# Patient Record
Sex: Female | Born: 1990
Health system: Southern US, Community
[De-identification: ages and names within clinical notes are randomized; demographics above are authoritative.]

## PROBLEM LIST (undated history)

## (undated) DIAGNOSIS — N840 Polyp of corpus uteri: Secondary | ICD-10-CM

## (undated) DIAGNOSIS — E0500 Thyrotoxicosis with diffuse goiter without thyrotoxic crisis or storm: Secondary | ICD-10-CM

## (undated) DIAGNOSIS — A6920 Lyme disease, unspecified: Secondary | ICD-10-CM

## (undated) DIAGNOSIS — E0590 Thyrotoxicosis, unspecified without thyrotoxic crisis or storm: Secondary | ICD-10-CM

## (undated) DIAGNOSIS — O24419 Gestational diabetes mellitus in pregnancy, unspecified control: Secondary | ICD-10-CM

## (undated) DIAGNOSIS — F1010 Alcohol abuse, uncomplicated: Secondary | ICD-10-CM

## (undated) HISTORY — DX: Polyp of corpus uteri: N84.0

## (undated) HISTORY — DX: Lyme disease, unspecified: A69.20

## (undated) HISTORY — DX: Thyrotoxicosis, unspecified without thyrotoxic crisis or storm: E05.90

## (undated) HISTORY — DX: Thyrotoxicosis with diffuse goiter without thyrotoxic crisis or storm: E05.00

## (undated) HISTORY — PX: DILATION AND CURETTAGE OF UTERUS: SHX78

## (undated) HISTORY — DX: Alcohol abuse, uncomplicated: F10.10

---

## 2020-02-15 DIAGNOSIS — Z03818 Encounter for observation for suspected exposure to other biological agents ruled out: Secondary | ICD-10-CM | POA: Diagnosis not present

## 2020-02-16 DIAGNOSIS — Z03818 Encounter for observation for suspected exposure to other biological agents ruled out: Secondary | ICD-10-CM | POA: Diagnosis not present

## 2020-02-20 DIAGNOSIS — Z20828 Contact with and (suspected) exposure to other viral communicable diseases: Secondary | ICD-10-CM | POA: Diagnosis not present

## 2020-02-22 DIAGNOSIS — Z20828 Contact with and (suspected) exposure to other viral communicable diseases: Secondary | ICD-10-CM | POA: Diagnosis not present

## 2020-02-26 DIAGNOSIS — R030 Elevated blood-pressure reading, without diagnosis of hypertension: Secondary | ICD-10-CM | POA: Diagnosis not present

## 2020-02-26 DIAGNOSIS — R1011 Right upper quadrant pain: Secondary | ICD-10-CM | POA: Diagnosis not present

## 2020-02-26 DIAGNOSIS — E0500 Thyrotoxicosis with diffuse goiter without thyrotoxic crisis or storm: Secondary | ICD-10-CM | POA: Diagnosis not present

## 2020-03-17 DIAGNOSIS — Z6841 Body Mass Index (BMI) 40.0 and over, adult: Secondary | ICD-10-CM | POA: Diagnosis not present

## 2020-03-17 DIAGNOSIS — Z01419 Encounter for gynecological examination (general) (routine) without abnormal findings: Secondary | ICD-10-CM | POA: Diagnosis not present

## 2020-03-18 DIAGNOSIS — Z03818 Encounter for observation for suspected exposure to other biological agents ruled out: Secondary | ICD-10-CM | POA: Diagnosis not present

## 2020-04-21 ENCOUNTER — Telehealth: Payer: Self-pay

## 2020-04-21 ENCOUNTER — Encounter: Payer: Self-pay | Admitting: Endocrinology

## 2020-04-21 ENCOUNTER — Other Ambulatory Visit: Payer: Self-pay

## 2020-04-21 ENCOUNTER — Ambulatory Visit: Payer: BC Managed Care – PPO | Admitting: Endocrinology

## 2020-04-21 DIAGNOSIS — E0590 Thyrotoxicosis, unspecified without thyrotoxic crisis or storm: Secondary | ICD-10-CM | POA: Diagnosis not present

## 2020-04-21 LAB — T4, FREE: Free T4: 1.57 ng/dL (ref 0.60–1.60)

## 2020-04-21 LAB — TSH: TSH: <0.01 u[IU]/mL — ABNORMAL LOW (ref 0.35–4.50)

## 2020-04-21 MED ORDER — METHIMAZOLE 5 MG PO TABS
5.0000 mg | ORAL_TABLET | Freq: Every day | ORAL | 3 refills | Status: DC
Start: 2020-04-21 — End: 2020-06-06

## 2020-04-21 NOTE — Progress Notes (Signed)
Subjective:    Patient ID: Madeline Meyer, female    DOB: 1990/07/15, 29 y.o.   MRN: [id number]  HPI Pt is self-referred by for hyperthyroidism.  Pt reports he was dx'ed with hyperthyroidism in 2021, during fertility evaluation.  She became pregnant soon thereafter.  se has never had XRT to the anterior neck, or thyroid surgery.  she does not consume kelp or any other non-prescribed thyroid medication.  She was rx'ed PTU, then tapazole.  She takes intermittently--however, she takes qd for the past 2 weeks.  She is now 7 mos postpartum.  She has stopped breast feeding.  pt states tremor, palpitations, and doe.  She is planning for another pregnancy in 2022.   Past Medical History:  Diagnosis Date  . Alcohol abuse   . Graves disease   . Lyme disease     Past Surgical History:  Procedure Laterality Date  . CESAREAN SECTION    . DILATION AND CURETTAGE OF UTERUS      Social History   Socioeconomic History  . Marital status: Single    Spouse name: Not on file  . Number of children: Not on file  . Years of education: Not on file  . Highest education level: Not on file  Occupational History  . Not on file  Tobacco Use  . Smoking status: Never Smoker  . Smokeless tobacco: Never Used  Substance and Sexual Activity  . Alcohol use: Yes  . Drug use: Never  . Sexual activity: Yes  Other Topics Concern  . Not on file  Social History Narrative  . Not on file   Social Determinants of Health   Financial Resource Strain:   . Difficulty of Paying Living Expenses: Not on file  Food Insecurity:   . Worried About Programme researcher, broadcasting/film/video in the Last Year: Not on file  . Ran Out of Food in the Last Year: Not on file  Transportation Needs:   . Lack of Transportation (Medical): Not on file  . Lack of Transportation (Non-Medical): Not on file  Physical Activity:   . Days of Exercise per Week: Not on file  . Minutes of Exercise per Session: Not on file  Stress:   . Feeling of Stress : Not  on file  Social Connections:   . Frequency of Communication with Friends and Family: Not on file  . Frequency of Social Gatherings with Friends and Family: Not on file  . Attends Religious Services: Not on file  . Active Member of Clubs or Organizations: Not on file  . Attends Banker Meetings: Not on file  . Marital Status: Not on file  Intimate Partner Violence:   . Fear of Current or Ex-Partner: Not on file  . Emotionally Abused: Not on file  . Physically Abused: Not on file  . Sexually Abused: Not on file    No current outpatient medications on file prior to visit.   No current facility-administered medications on file prior to visit.    Allergies  Allergen Reactions  . Cimetidine Hives    Family History  Problem Relation Age of Onset  . Graves' disease Maternal Grandmother     BP 123/84   Pulse 78   Ht 5\' 6"  (1.676 m)   Wt 291 lb (132 kg)   SpO2 98%   BMI 46.97 kg/m     Review of Systems Denies fever    Objective:   Physical Exam VS: see vs page GEN: no distress HEAD: head:  no deformity eyes: no periorbital swelling, no proptosis external nose and ears are normal NECK: supple, thyroid is not enlarged CHEST WALL: no deformity LUNGS: clear to auscultation CV: reg rate and rhythm, no murmur.  MUSCULOSKELETAL: gait is normal and steady EXTEMITIES: no deformity.  no leg edema.   NEURO:  cn 2-12 grossly intact.   readily moves all 4's.  sensation is intact to touch on all 4's.  No tremor.   SKIN:  Normal texture and temperature.  No rash or suspicious lesion is visible.  Not diaphoretic NODES:  None palpable at the neck PSYCH: alert, well-oriented.  Does not appear anxious nor depressed.  Lab Results  Component Value Date   TSH <0.01 (L) 04/21/2020   I have reviewed outside records, and summarized: Pt was noted to have suppressed TSH, and referred here.  She recently moved from [place] to [place].  She was seen for RUQ pain at [facility].      Assessment & Plan:  Hyperthyroidism, new to me.  prob due to Grave's Dz.  She declines RAI and surgery for now.  I rx'ed tapazole.

## 2020-04-21 NOTE — Telephone Encounter (Signed)
-----   Message from Romero Belling, MD sent at 04/21/2020 12:57 PM EDT ----- please contact patient: Thyroid is overactive, as we expected.  Please take the medication as prescribed, and redo the blood tests in 1 month. We'll let you know about the results.

## 2020-04-21 NOTE — Telephone Encounter (Signed)
Left message for patient to call back regarding results and recommendations. °

## 2020-04-21 NOTE — Telephone Encounter (Signed)
Patient aware of results and recommendations. °

## 2020-04-21 NOTE — Patient Instructions (Addendum)
Blood tests are requested for you today.  We'll let you know about the results.  If ever you have fever while taking methimazole, stop it and call us, even if the reason is obvious, because of the risk of a rare side-effect.   It is best to never miss the medication.  However, if you do miss it, next best is to double up the next time.   Please come back for a follow-up appointment in 3 months.  Please call sooner if the pregnancy happens.

## 2020-04-21 NOTE — Telephone Encounter (Signed)
-----   Message from Sean Ellison, MD sent at 04/21/2020 12:57 PM EDT ----- °please contact patient: °Thyroid is overactive, as we expected.  Please take the medication as prescribed, and redo the blood tests in 1 month. We'll let you know about the results.   ° °

## 2020-04-30 DIAGNOSIS — Z03818 Encounter for observation for suspected exposure to other biological agents ruled out: Secondary | ICD-10-CM | POA: Diagnosis not present

## 2020-05-22 ENCOUNTER — Ambulatory Visit: Payer: BC Managed Care – PPO

## 2020-06-06 ENCOUNTER — Other Ambulatory Visit (INDEPENDENT_AMBULATORY_CARE_PROVIDER_SITE_OTHER): Payer: BC Managed Care – PPO

## 2020-06-06 ENCOUNTER — Other Ambulatory Visit: Payer: Self-pay

## 2020-06-06 ENCOUNTER — Telehealth: Payer: Self-pay | Admitting: Endocrinology

## 2020-06-06 ENCOUNTER — Other Ambulatory Visit: Payer: Self-pay | Admitting: Endocrinology

## 2020-06-06 DIAGNOSIS — E0590 Thyrotoxicosis, unspecified without thyrotoxic crisis or storm: Secondary | ICD-10-CM | POA: Diagnosis not present

## 2020-06-06 LAB — T4, FREE: Free T4: 2.09 ng/dL — ABNORMAL HIGH (ref 0.60–1.60)

## 2020-06-06 LAB — TSH: TSH: <0.01 u[IU]/mL — ABNORMAL LOW (ref 0.35–4.50)

## 2020-06-06 MED ORDER — METHIMAZOLE 10 MG PO TABS: 10.0000 mg | ORAL_TABLET | Freq: Two times a day (BID) | ORAL | 1 refills | Status: DC

## 2020-06-19 DIAGNOSIS — Z7689 Persons encountering health services in other specified circumstances: Secondary | ICD-10-CM | POA: Diagnosis not present

## 2020-06-19 DIAGNOSIS — Z803 Family history of malignant neoplasm of breast: Secondary | ICD-10-CM | POA: Diagnosis not present

## 2020-07-14 DIAGNOSIS — Z03818 Encounter for observation for suspected exposure to other biological agents ruled out: Secondary | ICD-10-CM | POA: Diagnosis not present

## 2020-07-16 DIAGNOSIS — Z1152 Encounter for screening for COVID-19: Secondary | ICD-10-CM | POA: Diagnosis not present

## 2020-07-18 DIAGNOSIS — Z1152 Encounter for screening for COVID-19: Secondary | ICD-10-CM | POA: Diagnosis not present

## 2020-07-24 ENCOUNTER — Other Ambulatory Visit: Payer: Self-pay

## 2020-07-24 ENCOUNTER — Telehealth: Payer: Self-pay | Admitting: Endocrinology

## 2020-07-24 ENCOUNTER — Ambulatory Visit: Payer: BC Managed Care – PPO | Admitting: Endocrinology

## 2020-07-24 VITALS — BP 128/70 | HR 92 | Ht 65.0 in | Wt 279.0 lb

## 2020-07-24 DIAGNOSIS — E0590 Thyrotoxicosis, unspecified without thyrotoxic crisis or storm: Secondary | ICD-10-CM

## 2020-07-24 LAB — T4, FREE: Free T4: 1.85 ng/dL — ABNORMAL HIGH (ref 0.60–1.60)

## 2020-07-24 LAB — TSH: TSH: <0.01 u[IU]/mL — ABNORMAL LOW (ref 0.35–4.50)

## 2020-07-24 LAB — T3, FREE: T3, Free: 5.8 pg/mL — ABNORMAL HIGH (ref 2.3–4.2)

## 2020-07-24 MED ORDER — PROPYLTHIOURACIL 50 MG PO TABS: 200.0000 mg | ORAL_TABLET | Freq: Two times a day (BID) | ORAL | 5 refills | Status: DC

## 2020-07-24 NOTE — Telephone Encounter (Signed)
done

## 2020-07-24 NOTE — Progress Notes (Signed)
   Subjective:    Patient ID: Madeline Meyer, female    DOB: 07-01-1991, 30 y.o.   MRN: [id number]  HPI Pt returns for f/u of hyperthyroidism (dx'ed 2021, during fertility evaluation; she became pregnant soon thereafter; she was rx'ed PTU, then tapazole).  pt states she feels well in general.  Pt says she takes tapazole as rx'ed.  She wants to start another pregnancy soon.  She has reg menses.  Pt signs ROI for records from [place].   Past Medical History:  Diagnosis Date  . Alcohol abuse   . Graves disease   . Lyme disease     Past Surgical History:  Procedure Laterality Date  . CESAREAN SECTION    . DILATION AND CURETTAGE OF UTERUS      Social History   Socioeconomic History  . Marital status: Single    Spouse name: Not on file  . Number of children: Not on file  . Years of education: Not on file  . Highest education level: Not on file  Occupational History  . Not on file  Tobacco Use  . Smoking status: Never Smoker  . Smokeless tobacco: Never Used  Substance and Sexual Activity  . Alcohol use: Yes  . Drug use: Never  . Sexual activity: Yes  Other Topics Concern  . Not on file  Social History Narrative  . Not on file   Social Determinants of Health   Financial Resource Strain: Not on file  Food Insecurity: Not on file  Transportation Needs: Not on file  Physical Activity: Not on file  Stress: Not on file  Social Connections: Not on file  Intimate Partner Violence: Not on file    No current outpatient medications on file prior to visit.   No current facility-administered medications on file prior to visit.    Allergies  Allergen Reactions  . Cimetidine Hives    Family History  Problem Relation Age of Onset  . Graves' disease Maternal Grandmother     BP 128/70 (BP Location: Right Arm, Patient Position: Sitting, Cuff Size: Large)   Pulse 92   Ht 5\' 5"  (1.651 m)   Wt 279 lb (126.6 kg)   SpO2 97%   BMI 46.43 kg/m    Review of Systems Denies  fever.      Objective:   Physical Exam VITAL SIGNS:  See vs page GENERAL: no distress NECK: thyroid is slightly enlarged (R>L), but no palpable nodule.     Lab Results  Component Value Date   TSH <0.01 (L) 07/24/2020      Assessment & Plan:  Hyperthyroidism: uncontrolled I have sent a prescription to your pharmacy, to change to PTU, at higher than equilivent dosage.

## 2020-07-24 NOTE — Telephone Encounter (Signed)
Patient called and requested that the [facility] [address] - RX at current pharmacy over $100 [facility] [address] the RX will be $30.00  Call back # [phone]

## 2020-07-24 NOTE — Patient Instructions (Addendum)
Blood tests are requested for you today.  We'll let you know about the results.  Based on the results, we'll change methimazole to PTU. If ever you have fever while taking PTU, stop it and call us, even if the reason is obvious, because of the risk of a rare side-effect.   It is best to never miss the medication.  However, if you do miss it, next best is to double up the next time.   Please come back for a follow-up appointment in 3 months.  Please call sooner if the pregnancy happens.

## 2020-07-24 NOTE — Telephone Encounter (Signed)
Please see below.

## 2020-08-07 DIAGNOSIS — Z3149 Encounter for other procreative investigation and testing: Secondary | ICD-10-CM | POA: Diagnosis not present

## 2020-08-08 DIAGNOSIS — N6313 Unspecified lump in the right breast, lower outer quadrant: Secondary | ICD-10-CM | POA: Diagnosis not present

## 2020-08-08 DIAGNOSIS — Z3183 Encounter for assisted reproductive fertility procedure cycle: Secondary | ICD-10-CM | POA: Diagnosis not present

## 2020-08-19 DIAGNOSIS — N979 Female infertility, unspecified: Secondary | ICD-10-CM | POA: Diagnosis not present

## 2020-08-19 DIAGNOSIS — Z3200 Encounter for pregnancy test, result unknown: Secondary | ICD-10-CM | POA: Diagnosis not present

## 2020-08-19 DIAGNOSIS — Z3689 Encounter for other specified antenatal screening: Secondary | ICD-10-CM | POA: Diagnosis not present

## 2020-08-22 DIAGNOSIS — N979 Female infertility, unspecified: Secondary | ICD-10-CM | POA: Diagnosis not present

## 2020-09-02 ENCOUNTER — Telehealth: Payer: Self-pay | Admitting: Endocrinology

## 2020-09-02 NOTE — Telephone Encounter (Signed)
To Clinical Staff:  Patient called to advise that she is now pregnant and is wondering if she need additional lab work to determine what her current levels are.  Please call patient at [phone].   To Admin Staff:  patient indicated that she had requested a change to a female provider but I see nothing for that anywhere.  Has anyone gotten this request?

## 2020-09-03 NOTE — Telephone Encounter (Signed)
Please Advise

## 2020-09-04 ENCOUNTER — Other Ambulatory Visit: Payer: Self-pay | Admitting: Endocrinology

## 2020-09-04 DIAGNOSIS — E0590 Thyrotoxicosis, unspecified without thyrotoxic crisis or storm: Secondary | ICD-10-CM

## 2020-09-04 NOTE — Telephone Encounter (Signed)
Please F/U with this.  Thank you  

## 2020-09-04 NOTE — Telephone Encounter (Signed)
Please come in for blood tests. Please cancel f/u appt with me, and offer to female drs.

## 2020-09-08 NOTE — Telephone Encounter (Signed)
Please see if Dr Lonzo Cloud is willing to see patient.

## 2020-09-08 NOTE — Telephone Encounter (Signed)
Is this the one you messaged me about last week, ok to schedule her with me in 2 weeks

## 2020-09-08 NOTE — Telephone Encounter (Signed)
Please advise 

## 2020-09-18 DIAGNOSIS — Z3201 Encounter for pregnancy test, result positive: Secondary | ICD-10-CM | POA: Diagnosis not present

## 2020-09-25 ENCOUNTER — Other Ambulatory Visit: Payer: Self-pay

## 2020-09-29 ENCOUNTER — Encounter: Payer: Self-pay | Admitting: Internal Medicine

## 2020-09-29 ENCOUNTER — Other Ambulatory Visit: Payer: Self-pay

## 2020-09-29 ENCOUNTER — Ambulatory Visit: Payer: BC Managed Care – PPO | Admitting: Internal Medicine

## 2020-09-29 VITALS — BP 116/72 | HR 62 | Ht 65.0 in | Wt 279.2 lb

## 2020-09-29 DIAGNOSIS — E0590 Thyrotoxicosis, unspecified without thyrotoxic crisis or storm: Secondary | ICD-10-CM

## 2020-09-29 LAB — CBC WITH DIFFERENTIAL/PLATELET
Basophils Absolute: 0 10*3/uL (ref 0.0–0.1)
Basophils Relative: 0.5 % (ref 0.0–3.0)
Eosinophils Absolute: 0.1 10*3/uL (ref 0.0–0.7)
Eosinophils Relative: 1.1 % (ref 0.0–5.0)
HCT: 41.4 % (ref 36.0–46.0)
Hemoglobin: 13.5 g/dL (ref 12.0–15.0)
Lymphocytes Relative: 26 % (ref 12.0–46.0)
Lymphs Abs: 2.2 10*3/uL (ref 0.7–4.0)
MCHC: 32.7 g/dL (ref 30.0–36.0)
MCV: 78.4 fl (ref 78.0–100.0)
Monocytes Absolute: 0.5 10*3/uL (ref 0.1–1.0)
Monocytes Relative: 6.1 % (ref 3.0–12.0)
Neutro Abs: 5.6 10*3/uL (ref 1.4–7.7)
Neutrophils Relative %: 66.3 % (ref 43.0–77.0)
Platelets: 257 10*3/uL (ref 150.0–400.0)
RBC: 5.28 Mil/uL — ABNORMAL HIGH (ref 3.87–5.11)
RDW: 15.2 % (ref 11.5–15.5)
WBC: 8.4 10*3/uL (ref 4.0–10.5)

## 2020-09-29 LAB — COMPREHENSIVE METABOLIC PANEL
ALT: 52 U/L — ABNORMAL HIGH (ref 0–35)
AST: 27 U/L (ref 0–37)
Albumin: 3.9 g/dL (ref 3.5–5.2)
Alkaline Phosphatase: 91 U/L (ref 39–117)
BUN: 6 mg/dL (ref 6–23)
CO2: 28 mEq/L (ref 19–32)
Calcium: 9.4 mg/dL (ref 8.4–10.5)
Chloride: 101 mEq/L (ref 96–112)
Creatinine, Ser: 0.57 mg/dL (ref 0.40–1.20)
GFR: 120.68 mL/min (ref >60.00)
Glucose, Bld: 105 mg/dL — ABNORMAL HIGH (ref 70–99)
Potassium: 3.7 mEq/L (ref 3.5–5.1)
Sodium: 136 mEq/L (ref 135–145)
Total Bilirubin: 0.3 mg/dL (ref 0.2–1.2)
Total Protein: 7.2 g/dL (ref 6.0–8.3)

## 2020-09-29 LAB — TSH: TSH: 1.17 u[IU]/mL (ref 0.35–4.50)

## 2020-09-29 NOTE — Progress Notes (Signed)
Name: Madeline Meyer  MRN/ DOB: [id number], 1990/09/04    Age/ Sex: 30 y.o., female     PCP: Patient, No Pcp Per   Reason for Endocrinology Evaluation: Hyperthyroidism     Initial Endocrinology Clinic Visit: 04/21/2020    PATIENT IDENTIFIER: Madeline Meyer is a 30 y.o., female with a past medical history of hyperthyroidism. She has followed with [facility] since 04/21/2020 for consultative assistance with management of her Hyperthyroidism   HISTORICAL SUMMARY: The patient was first diagnosed with hyperthyroidism in 12/2018 during pregnancy. She was on PTU during first trimester to methimazole . Was non compliant with medication intake post delivery , was lost to follow up until established with Dr Everardo All 04/2020   She became pregnant by 09/2020   She switched care from Dr. Everardo All to me by 09/2020  SUBJECTIVE:    Today (09/29/2020):  Madeline Meyer is here for a follow up on hyperthyroidism during pregnancy.    She was initially on Methimazole but this was switched to PTU by 07/2020.  She is currently at 9.3 weeks of gestation  Weight has been stable  Recent "stomach bug" with diarrhea  Has occasional palpitations  Denies tremors   PTU 50 mg 4 tabs BID ( total of 8 tablets)   Maternal GM with Graves' disease  Paternal uncle with Graves' disease   HISTORY:  Past Medical History:  Past Medical History:  Diagnosis Date  . Alcohol abuse   . Graves disease   . Lyme disease     Past Surgical History:  Past Surgical History:  Procedure Laterality Date  . CESAREAN SECTION    . DILATION AND CURETTAGE OF UTERUS       Social History:  reports that she has never smoked. She has never used smokeless tobacco. She reports current alcohol use. She reports that she does not use drugs. Family History:  Family History  Problem Relation Age of Onset  . Graves' disease Maternal Grandmother       HOME MEDICATIONS: Allergies as of 09/29/2020       Reactions   Cimetidine Hives      Medication List       Accurate as of September 29, 2020  7:09 AM. If you have any questions, ask your nurse or doctor.        propylthiouracil 50 MG tablet Commonly known as: PTU Take 4 tablets (200 mg total) by mouth 2 (two) times daily.         OBJECTIVE:   PHYSICAL EXAM: VS: BP 116/72   Pulse 62   Ht 5\' 5"  (1.651 m)   Wt 279 lb 4 oz (126.7 kg)   LMP  (LMP Unknown)   SpO2 98%   BMI 46.47 kg/m    EXAM: General: Pt appears well and is in NAD  Eyes: External eye exam normal without stare, lid lag or exophthalmos.  EOM intact.    Neck: General: Supple without adenopathy. Thyroid: Thyroid size normal.  No goiter or nodules appreciated. No thyroid bruit.  Lungs: Clear with good BS bilat with no rales, rhonchi, or wheezes  Heart: Auscultation: RRR.  Abdomen: Normoactive bowel sounds, soft, nontender, without masses or organomegaly palpable  Extremities:  BL LE: No pretibial edema normal ROM and strength.  Mental Status: Judgment, insight: Intact Orientation: Oriented to time, place, and person Memory: Intact for recent and remote events Mood and affect: No depression, anxiety, or agitation     DATA REVIEWED:  Results for [name], Madeline "  NATTY" (MRN [id number]) as of 09/30/2020 12:24  Ref. Range 09/29/2020 11:59  Sodium Latest Ref Range: 135 - 145 mEq/L 136  Potassium Latest Ref Range: 3.5 - 5.1 mEq/L 3.7  Chloride Latest Ref Range: 96 - 112 mEq/L 101  CO2 Latest Ref Range: 19 - 32 mEq/L 28  Glucose Latest Ref Range: 70 - 99 mg/dL 673 (H)  BUN Latest Ref Range: 6 - 23 mg/dL 6  Creatinine Latest Ref Range: 0.40 - 1.20 mg/dL 4.19  Calcium Latest Ref Range: 8.4 - 10.5 mg/dL 9.4  Alkaline Phosphatase Latest Ref Range: 39 - 117 U/L 91  Albumin Latest Ref Range: 3.5 - 5.2 g/dL 3.9  AST Latest Ref Range: 0 - 37 U/L 27  ALT Latest Ref Range: 0 - 35 U/L 52 (H)  Total Protein Latest Ref Range: 6.0 - 8.3 g/dL 7.2  Total Bilirubin Latest  Ref Range: 0.2 - 1.2 mg/dL 0.3  GFR Latest Ref Range: >60.00 mL/min 120.68  WBC Latest Ref Range: 4.0 - 10.5 K/uL 8.4  RBC Latest Ref Range: 3.87 - 5.11 Mil/uL 5.28 (H)  Hemoglobin Latest Ref Range: 12.0 - 15.0 g/dL 37.9  HCT Latest Ref Range: 36.0 - 46.0 % 41.4  MCV Latest Ref Range: 78.0 - 100.0 fl 78.4  MCHC Latest Ref Range: 30.0 - 36.0 g/dL 02.4  RDW Latest Ref Range: 11.5 - 15.5 % 15.2  Platelets Latest Ref Range: 150.0 - 400.0 K/uL 257.0  Neutrophils Latest Ref Range: 43.0 - 77.0 % 66.3  Lymphocytes Latest Ref Range: 12.0 - 46.0 % 26.0  Monocytes Relative Latest Ref Range: 3.0 - 12.0 % 6.1  Eosinophil Latest Ref Range: 0.0 - 5.0 % 1.1  Basophil Latest Ref Range: 0.0 - 3.0 % 0.5  NEUT# Latest Ref Range: 1.4 - 7.7 K/uL 5.6  Lymphocyte # Latest Ref Range: 0.7 - 4.0 K/uL 2.2  Monocyte # Latest Ref Range: 0.1 - 1.0 K/uL 0.5  Eosinophils Absolute Latest Ref Range: 0.0 - 0.7 K/uL 0.1  Basophils Absolute Latest Ref Range: 0.0 - 0.1 K/uL 0.0  TSH Latest Ref Range: 0.35 - 4.50 uIU/mL 1.17  Thyroxine (T4) Latest Ref Range: 5.1 - 11.9 mcg/dL 7.9     ASSESSMENT / PLAN / RECOMMENDATIONS:   1. Hyperthyroidism:  - Has been diagnosed with Graves' disease since 2020 with her first pregnancy. Was lost to follow up  - She is clinically euthyroid  - The goal of treatment is to maintain persistent but mild hyperthyroidism in the mother in an attempt to prevent fetal hypothyroidism since the fetal thyroid is more sensitive to the action of thionamide therapy. Overtreatment of maternal hyperthyroidism can cause fetal goiter and primary hypothyroidism.  - Repeat labs show normal TSh and T4, will reduce PTU as below   - TRAb pending    Medications   Decrease PTU 50 mg to 3  tabs BID ( total of 6 tablets)     F/U in 2 months  Labs in 4 weeks   Signed electronically by: Lyndle Herrlich, MD  [facility]  [facility] [address] [place] [place], [place] [address] Phone: [phone] FAX: [phone]      CC: Patient, No Pcp Per No address on file Phone: None  Fax: None   Return to Endocrinology clinic as below: Future Appointments  Date Time Provider Department Center  09/29/2020 11:30 AM Titus Drone, Konrad Dolores, MD [facility] None  01/16/2021  1:30 PM Orland Mustard, MD [facility]

## 2020-09-30 ENCOUNTER — Encounter: Payer: Self-pay | Admitting: Internal Medicine

## 2020-10-01 LAB — TRAB (TSH RECEPTOR BINDING ANTIBODY): TRAB: 4.47 IU/L — ABNORMAL HIGH (ref <=2.00)

## 2020-10-01 LAB — T4: T4, Total: 7.9 ug/dL (ref 5.1–11.9)

## 2020-10-06 DIAGNOSIS — O99281 Endocrine, nutritional and metabolic diseases complicating pregnancy, first trimester: Secondary | ICD-10-CM | POA: Diagnosis not present

## 2020-10-06 DIAGNOSIS — Z3A10 10 weeks gestation of pregnancy: Secondary | ICD-10-CM | POA: Diagnosis not present

## 2020-10-06 DIAGNOSIS — Z3689 Encounter for other specified antenatal screening: Secondary | ICD-10-CM | POA: Diagnosis not present

## 2020-10-06 DIAGNOSIS — O3680X0 Pregnancy with inconclusive fetal viability, not applicable or unspecified: Secondary | ICD-10-CM | POA: Diagnosis not present

## 2020-10-06 DIAGNOSIS — Z124 Encounter for screening for malignant neoplasm of cervix: Secondary | ICD-10-CM | POA: Diagnosis not present

## 2020-10-06 LAB — OB RESULTS CONSOLE RPR: RPR: NONREACTIVE

## 2020-10-06 LAB — OB RESULTS CONSOLE HIV ANTIBODY (ROUTINE TESTING): HIV: NONREACTIVE

## 2020-10-06 LAB — OB RESULTS CONSOLE GC/CHLAMYDIA
Chlamydia: NEGATIVE
Gonorrhea: NEGATIVE

## 2020-10-06 LAB — OB RESULTS CONSOLE VARICELLA ZOSTER ANTIBODY, IGG: Varicella: IMMUNE

## 2020-10-06 LAB — OB RESULTS CONSOLE HEPATITIS B SURFACE ANTIGEN: Hepatitis B Surface Ag: NEGATIVE

## 2020-10-06 LAB — OB RESULTS CONSOLE ABO/RH: RH Type: POSITIVE

## 2020-10-06 LAB — OB RESULTS CONSOLE RUBELLA ANTIBODY, IGM: Rubella: IMMUNE

## 2020-10-14 DIAGNOSIS — Z03818 Encounter for observation for suspected exposure to other biological agents ruled out: Secondary | ICD-10-CM | POA: Diagnosis not present

## 2020-10-27 ENCOUNTER — Other Ambulatory Visit (INDEPENDENT_AMBULATORY_CARE_PROVIDER_SITE_OTHER): Payer: BC Managed Care – PPO

## 2020-10-27 ENCOUNTER — Ambulatory Visit: Payer: BC Managed Care – PPO | Admitting: Endocrinology

## 2020-10-27 ENCOUNTER — Telehealth: Payer: Self-pay | Admitting: Internal Medicine

## 2020-10-27 DIAGNOSIS — E0590 Thyrotoxicosis, unspecified without thyrotoxic crisis or storm: Secondary | ICD-10-CM | POA: Diagnosis not present

## 2020-10-27 LAB — TSH: TSH: 3.69 u[IU]/mL (ref 0.35–4.50)

## 2020-10-27 NOTE — Telephone Encounter (Signed)
Patient came in for lab and at check in for labs requested that her PTU be changed back to Methimazole since she is not pass the 14 week mark in her pregnancy.  [facility] [address] [facility]

## 2020-10-27 NOTE — Telephone Encounter (Signed)
Please advise 

## 2020-10-28 LAB — T4: T4, Total: 6.7 ug/dL (ref 5.1–11.9)

## 2020-10-28 MED ORDER — METHIMAZOLE 5 MG PO TABS: 15.0000 mg | ORAL_TABLET | Freq: Every day | ORAL | 1 refills | Status: DC

## 2020-10-28 NOTE — Telephone Encounter (Signed)
Spoken to patient and notified Dr Shamleffer's comments. Verbalized understanding.   

## 2020-10-28 NOTE — Telephone Encounter (Signed)
I sent a portal message but in case she doesn't get the message  STOP PTU  Start Methimazole 5 mg, THREE tablets ONCE daily from now on

## 2020-11-05 ENCOUNTER — Encounter: Payer: Self-pay | Admitting: Internal Medicine

## 2020-11-24 ENCOUNTER — Encounter: Payer: Self-pay | Admitting: Internal Medicine

## 2020-11-24 ENCOUNTER — Ambulatory Visit (INDEPENDENT_AMBULATORY_CARE_PROVIDER_SITE_OTHER): Payer: 59 | Admitting: Internal Medicine

## 2020-11-24 ENCOUNTER — Other Ambulatory Visit: Payer: Self-pay

## 2020-11-24 VITALS — BP 116/82 | HR 68 | Ht 65.0 in | Wt 279.1 lb

## 2020-11-24 DIAGNOSIS — E0590 Thyrotoxicosis, unspecified without thyrotoxic crisis or storm: Secondary | ICD-10-CM

## 2020-11-24 LAB — TSH: TSH: 1.88 u[IU]/mL (ref 0.35–4.50)

## 2020-11-24 LAB — T4, FREE: Free T4: 0.55 ng/dL — ABNORMAL LOW (ref 0.60–1.60)

## 2020-11-24 NOTE — Progress Notes (Signed)
Name: Madeline Meyer  MRN/ DOB: [id number], [birth year]    Age/ Sex: 30 y.o., female     PCP: Patient, No Pcp Per (Inactive)   Reason for Endocrinology Evaluation: Hyperthyroidism     Initial Endocrinology Clinic Visit: 04/21/2020    PATIENT IDENTIFIER: Madeline Meyer is a 30 y.o., female with a past medical history of hyperthyroidism. She has followed with  Endocrinology clinic since 04/21/2020 for consultative assistance with management of her Hyperthyroidism   HISTORICAL SUMMARY: The patient was first diagnosed with hyperthyroidism in 12/2018 during pregnancy. She was on PTU during first trimester , then switched to methimazole . Was non compliant with medication intake post delivery , was lost to follow up until established with Dr Everardo All 04/2020   She switched care from Dr. Everardo All to me by 09/2020.  She had already been switched from methimazole to PTU in 07/2020.  By the time she saw me for the first time she was already pregnant in her first trimester at the time   We will switch PTU to methimazole during her second trimester in April 2022    Maternal GM with Graves' disease  Paternal uncle with Graves' disease  SUBJECTIVE:    Today (11/24/2020):  [name] is here for a follow up on hyperthyroidism during pregnancy.     She is currently at 17.3 weeks of gestation  Weight has been stable  Denies nausea or vomiting  Denies palpitations   Denies tremors per , but had transient right hand tremors   Methimazole 5 mg 3 tabs daily    HISTORY:  Past Medical History:  Past Medical History:  Diagnosis Date  . Alcohol abuse   . Graves disease   . Lyme disease    Past Surgical History:  Past Surgical History:  Procedure Laterality Date  . CESAREAN SECTION    . DILATION AND CURETTAGE OF UTERUS     Social History:  reports that she has never smoked. She has never used smokeless tobacco. She reports current alcohol use. She reports that she does  not use drugs. Family History:  Family History  Problem Relation Age of Onset  . Graves' disease Maternal Grandmother      HOME MEDICATIONS: Allergies as of 11/24/2020      Reactions   Cimetidine Hives      Medication List       Accurate as of Nov 24, 2020 12:44 PM. If you have any questions, ask your nurse or doctor.        methimazole 5 MG tablet Commonly known as: TAPAZOLE Take 3 tablets (15 mg total) by mouth daily.   progesterone 200 MG capsule Commonly known as: PROMETRIUM Take 200 mg by mouth at bedtime.         OBJECTIVE:   PHYSICAL EXAM: VS: LMP  (LMP Unknown)    EXAM: General: Pt appears well and is in NAD  Eyes: External eye exam normal without stare, lid lag or exophthalmos.  EOM intact.    Neck: General: Supple without adenopathy. Thyroid: Thyroid size normal.  No goiter or nodules appreciated. No thyroid bruit.  Lungs: Clear with good BS bilat with no rales, rhonchi, or wheezes  Heart: Auscultation: RRR.  Abdomen: Normoactive bowel sounds, soft, nontender, without masses or organomegaly palpable  Extremities:  BL LE: No pretibial edema normal ROM and strength.  Mental Status: Judgment, insight: Intact Orientation: Oriented to time, place, and person Memory: Intact for recent and remote events Mood and affect: No depression, anxiety,  or agitation     DATA REVIEWED:  Results for CHARLSIE, FLEEGER" (MRN [id number]) as of 09/30/2020 12:24  Ref. Range 09/29/2020 11:59  Sodium Latest Ref Range: 135 - 145 mEq/L 136  Potassium Latest Ref Range: 3.5 - 5.1 mEq/L 3.7  Chloride Latest Ref Range: 96 - 112 mEq/L 101  CO2 Latest Ref Range: 19 - 32 mEq/L 28  Glucose Latest Ref Range: 70 - 99 mg/dL 831 (H)  BUN Latest Ref Range: 6 - 23 mg/dL 6  Creatinine Latest Ref Range: 0.40 - 1.20 mg/dL 5.17  Calcium Latest Ref Range: 8.4 - 10.5 mg/dL 9.4  Alkaline Phosphatase Latest Ref Range: 39 - 117 U/L 91  Albumin Latest Ref Range: 3.5 - 5.2 g/dL 3.9   AST Latest Ref Range: 0 - 37 U/L 27  ALT Latest Ref Range: 0 - 35 U/L 52 (H)  Total Protein Latest Ref Range: 6.0 - 8.3 g/dL 7.2  Total Bilirubin Latest Ref Range: 0.2 - 1.2 mg/dL 0.3  GFR Latest Ref Range: >60.00 mL/min 120.68  WBC Latest Ref Range: 4.0 - 10.5 K/uL 8.4  RBC Latest Ref Range: 3.87 - 5.11 Mil/uL 5.28 (H)  Hemoglobin Latest Ref Range: 12.0 - 15.0 g/dL 61.6  HCT Latest Ref Range: 36.0 - 46.0 % 41.4  MCV Latest Ref Range: 78.0 - 100.0 fl 78.4  MCHC Latest Ref Range: 30.0 - 36.0 g/dL 07.3  RDW Latest Ref Range: 11.5 - 15.5 % 15.2  Platelets Latest Ref Range: 150.0 - 400.0 K/uL 257.0  Neutrophils Latest Ref Range: 43.0 - 77.0 % 66.3  Lymphocytes Latest Ref Range: 12.0 - 46.0 % 26.0  Monocytes Relative Latest Ref Range: 3.0 - 12.0 % 6.1  Eosinophil Latest Ref Range: 0.0 - 5.0 % 1.1  Basophil Latest Ref Range: 0.0 - 3.0 % 0.5  NEUT# Latest Ref Range: 1.4 - 7.7 K/uL 5.6  Lymphocyte # Latest Ref Range: 0.7 - 4.0 K/uL 2.2  Monocyte # Latest Ref Range: 0.1 - 1.0 K/uL 0.5  Eosinophils Absolute Latest Ref Range: 0.0 - 0.7 K/uL 0.1  Basophils Absolute Latest Ref Range: 0.0 - 0.1 K/uL 0.0   Results for KERILYN, CORTNER "NATTY" (MRN [id number]) as of 11/25/2020 14:40  Ref. Range 11/24/2020 14:43  TSH Latest Ref Range: 0.35 - 4.50 uIU/mL 1.88  T4,Free(Direct) Latest Ref Range: 0.60 - 1.60 ng/dL 5.46 (L)    ASSESSMENT / PLAN / RECOMMENDATIONS:   Hyperthyroidism secondary to Graves' Disease:  - Has been diagnosed with Graves' disease since 2020 with her first pregnancy.  - She is clinically euthyroid  - The goal of treatment is to maintain persistent but mild hyperthyroidism in the mother in an attempt to prevent fetal hypothyroidism since the fetal thyroid is more sensitive to the action of thionamide therapy. Overtreatment of maternal hyperthyroidism can cause fetal goiter and primary hypothyroidism.  - Repeat labs show normal TSh and low FT4 reduce methimazole as below       Medications   Decrease Methimazole 5 mg, to 1 tablet daily   2. Graves' Disease:   - No extra thyroidal manifestations of thyroid disease      F/U in 3 months  Labs in 6 weeks    Addendum: Discussed lab results with pt on 11/25/2020  Signed electronically by: Lyndle Herrlich, MD  [facility]  [facility] [address] [place] [place], [place] [address] Phone: [phone] FAX: [phone]      CC: Patient, No Pcp Per (Inactive) No address on  file Phone: None  Fax: None   Return to Endocrinology clinic as below: Future Appointments  Date Time Provider Department Center  11/24/2020  2:00 PM Audrina Marten, Konrad Dolores, MD [facility] None  01/16/2021  1:30 PM Orland Mustard, MD [facility]

## 2020-11-25 MED ORDER — METHIMAZOLE 5 MG PO TABS: 5.0000 mg | ORAL_TABLET | Freq: Every day | ORAL | 1 refills | Status: DC

## 2020-11-26 ENCOUNTER — Other Ambulatory Visit: Payer: Self-pay | Admitting: Endocrinology

## 2020-12-19 ENCOUNTER — Other Ambulatory Visit: Payer: Self-pay | Admitting: Obstetrics and Gynecology

## 2020-12-19 DIAGNOSIS — Z363 Encounter for antenatal screening for malformations: Secondary | ICD-10-CM

## 2021-01-06 ENCOUNTER — Telehealth: Payer: Self-pay | Admitting: Internal Medicine

## 2021-01-06 NOTE — Telephone Encounter (Signed)
Patient called and is out of methimazole 5 mg.  Her lab work is on 01/09/21 but [facility] [address] Has been out since at 01-03-21  Call back # [phone]

## 2021-01-07 NOTE — Telephone Encounter (Signed)
Pt has been out of medication since Friday, she requests a refill on her Methimazole to be sent to: [id number] [place] [place] - [address] Phone:  [phone]  Fax:  [phone]      Pt is pregnant and is supposed to be monitoring her levels on this medication but pt has been out since last Friday, she would like to know if she should still come in for labs this week?  Pt requests a MyChart message when the medication refill has been sent in.

## 2021-01-08 ENCOUNTER — Other Ambulatory Visit: Payer: Self-pay

## 2021-01-08 MED ORDER — METHIMAZOLE 5 MG PO TABS: 5.0000 mg | ORAL_TABLET | Freq: Every day | ORAL | 0 refills | Status: DC

## 2021-01-09 ENCOUNTER — Other Ambulatory Visit: Payer: Self-pay

## 2021-01-09 ENCOUNTER — Other Ambulatory Visit: Payer: 59

## 2021-01-09 ENCOUNTER — Other Ambulatory Visit (INDEPENDENT_AMBULATORY_CARE_PROVIDER_SITE_OTHER): Payer: 59

## 2021-01-09 DIAGNOSIS — E0590 Thyrotoxicosis, unspecified without thyrotoxic crisis or storm: Secondary | ICD-10-CM | POA: Diagnosis not present

## 2021-01-09 LAB — TSH: TSH: 0.01 u[IU]/mL — ABNORMAL LOW (ref 0.35–5.50)

## 2021-01-09 LAB — T4: T4, Total: 16.1 ug/dL — ABNORMAL HIGH (ref 5.1–11.9)

## 2021-01-09 NOTE — Telephone Encounter (Signed)
Pt already came in the office today for the labs and sent the Rx methamazole to the pharmacy

## 2021-01-11 MED ORDER — METHIMAZOLE 5 MG PO TABS: 7.5000 mg | ORAL_TABLET | Freq: Every day | ORAL | 1 refills | Status: DC

## 2021-01-11 NOTE — Addendum Note (Signed)
Addended by: Scarlette Shorts on: 01/11/2021 12:17 PM   Modules accepted: Orders

## 2021-01-12 ENCOUNTER — Ambulatory Visit: Payer: 59 | Attending: Obstetrics and Gynecology

## 2021-01-12 ENCOUNTER — Ambulatory Visit (HOSPITAL_BASED_OUTPATIENT_CLINIC_OR_DEPARTMENT_OTHER): Payer: 59 | Admitting: Obstetrics and Gynecology

## 2021-01-12 ENCOUNTER — Ambulatory Visit: Payer: 59 | Admitting: *Deleted

## 2021-01-12 ENCOUNTER — Other Ambulatory Visit: Payer: Self-pay | Admitting: *Deleted

## 2021-01-12 ENCOUNTER — Encounter: Payer: Self-pay | Admitting: *Deleted

## 2021-01-12 ENCOUNTER — Other Ambulatory Visit: Payer: Self-pay

## 2021-01-12 VITALS — BP 106/58 | HR 71

## 2021-01-12 DIAGNOSIS — O99212 Obesity complicating pregnancy, second trimester: Secondary | ICD-10-CM

## 2021-01-12 DIAGNOSIS — O09812 Supervision of pregnancy resulting from assisted reproductive technology, second trimester: Secondary | ICD-10-CM

## 2021-01-12 DIAGNOSIS — Z6841 Body Mass Index (BMI) 40.0 and over, adult: Secondary | ICD-10-CM

## 2021-01-12 DIAGNOSIS — Z363 Encounter for antenatal screening for malformations: Secondary | ICD-10-CM | POA: Diagnosis not present

## 2021-01-12 DIAGNOSIS — E0590 Thyrotoxicosis, unspecified without thyrotoxic crisis or storm: Secondary | ICD-10-CM | POA: Insufficient documentation

## 2021-01-12 DIAGNOSIS — O34219 Maternal care for unspecified type scar from previous cesarean delivery: Secondary | ICD-10-CM

## 2021-01-12 DIAGNOSIS — O99282 Endocrine, nutritional and metabolic diseases complicating pregnancy, second trimester: Secondary | ICD-10-CM

## 2021-01-12 NOTE — Progress Notes (Signed)
Maternal-Fetal Medicine   Name: Madeline Meyer DOB: 1991/01/31 MRN: [id number] Referring Provider: Clance Boll, DO   I had the pleasure of seeing [facility] at [facility].  She is here for fetal anatomy scan.  Patient had fertility treatment and conceived by intrauterine insemination (donor sperm). Her medical history significant for diagnosis of hyperthyroidism Madeline Meyer' disease) in June 2020 in her previous pregnancy.  She had discontinued PTU after the first trimester and now takes methimazole 7.5 mg daily and her dosage was increased yesterday from 5 mg.  Her most recent TSH level assessed 3 days ago was 0.01 (consistent with hyperthyroidism).  She is being followed by her endocrinologist.  Patient does not have symptoms of palpitations or chest pain or hair loss.  She reports she has been asymptomatic. She does not have a thyroid nodule.  She does not have hypertension or diabetes or any other chronic medical conditions. Past surgical history: Cesarean section.  Obstetric history significant for a term cesarean delivery in March 2021 Madeline Meyer) at [redacted] weeks gestation of a female infant weighing 7 pounds and 1 ounce at birth.  Her pregnancy was complicated by oligohydramnios.  Her daughter is in good health.  Patient reports that TRAb levels assessed in the third trimester was normal. Allergies: Cimetidine (hives). Social history: Denies tobacco or drug or alcohol use.  She has been married 6 years and her husband is in good health. Family history: No history of venous thromboembolism in the family. Prenatal course: On cell free fetal DNA screening, the risks of fetal aneuploidies are not increased.  MSAFP screening showed low risk for open neural tube defects.  Fetal anatomical survey could not be completed at your office  Blood pressure today at her office is 106/58 mmHg and pulse 71/minute.  Prepregnancy BMI 45.3.  Ultrasound We performed a  fetal anatomy scan.  Fetal biometry is consistent with the previously established dates.  Amniotic fluid is normal and good fetal activity seen.  No markers of aneuploidies or fetal structural defects are seen.  Fetal heart rate and rhythm are normal. Placenta is anterior and there is no evidence of previa or placenta accreta spectrum (PAS). Fetal anatomical survey was limited because of maternal body habitus.  Our concerns include: Hyperthyroidism in pregnancy -Antithyroid drugs (ATD) form the first-line of treatment in hyperthyroidism in pregnancy. PTU was appropriately discontinued after first trimester and substituted with methimazole (MMI) in the second trimester.  Carbimazole (CBZ) and MMI have been reported to be associated with fetal congenital malformations including aplasia cutis, dysmorphic facial features and choanal or esophageal atresia. However, the absolute risk is very small.   -ATD dosage should be adjusted to keep FT4 levels in the upper limits of normal. Dosage can be adjusted every 2 to 4 weeks.   -Beta-blocker (propranolol) should be added if patient has tachycardia (hyperadrenergic symptoms) -Radio-iodine treatment is contraindicated in pregnancy.  Poorly-controlled hyperthyroidism can lead to fetal growth restriction, congestive heart failure, hydrops and preterm delivery. Gestational hypertension/preeclampsia and placental abruption are more common in pregnancies with hyperthyroidism. TRAb levels should be monitored in the second and third trimester. Increased TRAb levels (3 to 5 times above normal) are associated with fetal and neonatal hyperthyroidism.  Fetal hyperthyroidism can occur even in the presence of normal maternal FT4 levels if TRAb is increased. Fetal goiter, if present, is indicative of fetal hyperthyroidism.   I recommend serial fetal growth assessments and weekly BPP from 32 or 34 weeks' gestation till  delivery.  Neonatal thyroid screening is the standard  of care.  Previous cesarean section I briefly discussed VBAC, its benefits and risks (1% scar dehiscence in labor).  Repeat cesarean section increases the risk of placenta previa or placenta accreta spectrum. VBAC/repeat cesarean delivery may be discussed in the third trimester.  Recommendations: -Close follow-up with endocrinologist for adjustment of dosage and treatment with ATD. -Thyroid storm can occur if inadequately treated. -An appointment was made for her to return in 4 weeks for completion of fetal anatomy. -Serial fetal growth assessments (every 4 weeks) that may be performed at your office. -Weekly antenatal testing from 32 to 34 weeks until delivery.  Thank you for consultation.  If you have any questions or concerns, please contact me the [facility].  Consultation including face-to-face counseling (more than 50% of time) 30 minutes.

## 2021-01-16 ENCOUNTER — Ambulatory Visit: Payer: BC Managed Care – PPO | Admitting: Family Medicine

## 2021-02-11 ENCOUNTER — Other Ambulatory Visit: Payer: Self-pay

## 2021-02-11 ENCOUNTER — Ambulatory Visit: Payer: 59 | Admitting: *Deleted

## 2021-02-11 ENCOUNTER — Encounter: Payer: 59 | Attending: Obstetrics and Gynecology | Admitting: Registered"

## 2021-02-11 ENCOUNTER — Ambulatory Visit: Payer: 59 | Attending: Obstetrics and Gynecology

## 2021-02-11 VITALS — BP 124/63 | HR 72

## 2021-02-11 DIAGNOSIS — Z362 Encounter for other antenatal screening follow-up: Secondary | ICD-10-CM | POA: Diagnosis not present

## 2021-02-11 DIAGNOSIS — O99213 Obesity complicating pregnancy, third trimester: Secondary | ICD-10-CM | POA: Insufficient documentation

## 2021-02-11 DIAGNOSIS — O34219 Maternal care for unspecified type scar from previous cesarean delivery: Secondary | ICD-10-CM | POA: Insufficient documentation

## 2021-02-11 DIAGNOSIS — Z6841 Body Mass Index (BMI) 40.0 and over, adult: Secondary | ICD-10-CM

## 2021-02-11 DIAGNOSIS — Z3A28 28 weeks gestation of pregnancy: Secondary | ICD-10-CM | POA: Insufficient documentation

## 2021-02-11 DIAGNOSIS — E669 Obesity, unspecified: Secondary | ICD-10-CM | POA: Insufficient documentation

## 2021-02-11 DIAGNOSIS — O09813 Supervision of pregnancy resulting from assisted reproductive technology, third trimester: Secondary | ICD-10-CM

## 2021-02-11 DIAGNOSIS — O24419 Gestational diabetes mellitus in pregnancy, unspecified control: Secondary | ICD-10-CM | POA: Insufficient documentation

## 2021-02-11 DIAGNOSIS — O99283 Endocrine, nutritional and metabolic diseases complicating pregnancy, third trimester: Secondary | ICD-10-CM | POA: Diagnosis not present

## 2021-02-11 DIAGNOSIS — E0590 Thyrotoxicosis, unspecified without thyrotoxic crisis or storm: Secondary | ICD-10-CM | POA: Insufficient documentation

## 2021-02-16 ENCOUNTER — Encounter: Payer: Self-pay | Admitting: Registered"

## 2021-02-16 DIAGNOSIS — O24419 Gestational diabetes mellitus in pregnancy, unspecified control: Secondary | ICD-10-CM | POA: Insufficient documentation

## 2021-02-16 NOTE — Progress Notes (Signed)
Patient was seen on 02/11/21 for Gestational Diabetes self-management class at [facility]. The following learning objectives were met by the patient during this course:  States the definition of Gestational Diabetes States why dietary management is important in controlling blood glucose Describes the effects each nutrient has on blood glucose levels Demonstrates ability to create a balanced meal plan Demonstrates carbohydrate counting  States when to check blood glucose levels Demonstrates proper blood glucose monitoring techniques States the effect of stress and exercise on blood glucose levels States the importance of limiting caffeine and abstaining from alcohol and smoking  Blood glucose monitor given: Patient has meter and is checking blood sugar prior to class   Patient instructed to monitor glucose levels: FBS: 60 - <95; 1 hour: <140; 2 hour: <120  Patient received handouts: Nutrition Diabetes and Pregnancy, including carb counting list  Patient will be seen for follow-up as needed.

## 2021-02-24 ENCOUNTER — Other Ambulatory Visit: Payer: Self-pay

## 2021-02-24 ENCOUNTER — Ambulatory Visit (INDEPENDENT_AMBULATORY_CARE_PROVIDER_SITE_OTHER): Payer: 59 | Admitting: Internal Medicine

## 2021-02-24 ENCOUNTER — Encounter: Payer: Self-pay | Admitting: Internal Medicine

## 2021-02-24 VITALS — BP 114/68 | HR 86 | Ht 65.0 in | Wt 279.2 lb

## 2021-02-24 DIAGNOSIS — E0590 Thyrotoxicosis, unspecified without thyrotoxic crisis or storm: Secondary | ICD-10-CM

## 2021-02-24 DIAGNOSIS — E0500 Thyrotoxicosis with diffuse goiter without thyrotoxic crisis or storm: Secondary | ICD-10-CM

## 2021-02-24 LAB — CBC WITH DIFFERENTIAL/PLATELET
Basophils Absolute: 0 10*3/uL (ref 0.0–0.1)
Basophils Relative: 0.2 % (ref 0.0–3.0)
Eosinophils Absolute: 0.1 10*3/uL (ref 0.0–0.7)
Eosinophils Relative: 0.8 % (ref 0.0–5.0)
HCT: 36.6 % (ref 36.0–46.0)
Hemoglobin: 12 g/dL (ref 12.0–15.0)
Lymphocytes Relative: 16 % (ref 12.0–46.0)
Lymphs Abs: 1.6 10*3/uL (ref 0.7–4.0)
MCHC: 32.8 g/dL (ref 30.0–36.0)
MCV: 81.4 fl (ref 78.0–100.0)
Monocytes Absolute: 0.7 10*3/uL (ref 0.1–1.0)
Monocytes Relative: 7 % (ref 3.0–12.0)
Neutro Abs: 7.5 10*3/uL (ref 1.4–7.7)
Neutrophils Relative %: 76 % (ref 43.0–77.0)
Platelets: 219 10*3/uL (ref 150.0–400.0)
RBC: 4.49 Mil/uL (ref 3.87–5.11)
RDW: 13.8 % (ref 11.5–15.5)
WBC: 9.9 10*3/uL (ref 4.0–10.5)

## 2021-02-24 LAB — COMPREHENSIVE METABOLIC PANEL
ALT: 13 U/L (ref 0–35)
AST: 11 U/L (ref 0–37)
Albumin: 3.5 g/dL (ref 3.5–5.2)
Alkaline Phosphatase: 86 U/L (ref 39–117)
BUN: 6 mg/dL (ref 6–23)
CO2: 26 mEq/L (ref 19–32)
Calcium: 9.2 mg/dL (ref 8.4–10.5)
Chloride: 103 mEq/L (ref 96–112)
Creatinine, Ser: 0.53 mg/dL (ref 0.40–1.20)
GFR: 122.46 mL/min (ref >60.00)
Glucose, Bld: 80 mg/dL (ref 70–99)
Potassium: 4.1 mEq/L (ref 3.5–5.1)
Sodium: 136 mEq/L (ref 135–145)
Total Bilirubin: 0.3 mg/dL (ref 0.2–1.2)
Total Protein: 6.5 g/dL (ref 6.0–8.3)

## 2021-02-24 LAB — T4, FREE: Free T4: 0.96 ng/dL (ref 0.60–1.60)

## 2021-02-24 NOTE — Progress Notes (Signed)
Name: Madeline Meyer  MRN/ DOB: [id number], 1991-02-11    Age/ Sex: 30 y.o., female     PCP: Patient, No Pcp Per (Inactive)   Reason for Endocrinology Evaluation: Hyperthyroidism     Initial Endocrinology Clinic Visit: 04/21/2020    PATIENT IDENTIFIER: Madeline Meyer is a 30 y.o., female with a past medical history of hyperthyroidism. She has followed with [facility] since 04/21/2020 for consultative assistance with management of her Hyperthyroidism   HISTORICAL SUMMARY: The patient was first diagnosed with hyperthyroidism in 12/2018 during pregnancy. She was on PTU during first trimester , then switched to methimazole . Was non compliant with medication intake post delivery , was lost to follow up until established with Dr Everardo All 04/2020   She switched care from Dr. Everardo All to me by 09/2020.  She had already been switched from methimazole to PTU in 07/2020.  By the time she saw me for the first time she was already pregnant in her first trimester at the time   We will switch PTU to methimazole during her second trimester in April 2022    Maternal GM with Graves' disease  Paternal uncle with Graves' disease  SUBJECTIVE:    Today (02/24/2021):  Madeline Meyer is here for a follow up on hyperthyroidism during pregnancy.     She is currently at 30.4  weeks of gestation  Weight has been stable   Recent follow up on anatomy scans - boy   Denies nausea or vomiting  Denies palpitations  Denies local neck symptoms  No eye symptoms   EDD 10/21st  C-section   Methimazole 5 mg, 1.5 tabs daily   Was diagnosed with gestation diabetes, she is checking 4x a day , she took class about nutrition  BG range 70- 118 mg/dL    HISTORY:  Past Medical History:  Past Medical History:  Diagnosis Date  . Alcohol abuse   . Graves disease   . Hyperthyroidism   . Lyme disease   . Uterine polyp    Past Surgical History:  Past Surgical History:  Procedure  Laterality Date  . CESAREAN SECTION    . DILATION AND CURETTAGE OF UTERUS     Social History:  reports that she has never smoked. She has never used smokeless tobacco. She reports that she does not currently use alcohol. She reports that she does not use drugs. Family History:  Family History  Problem Relation Age of Onset  . Graves' disease Maternal Grandmother      HOME MEDICATIONS: Allergies as of 02/24/2021       Reactions   Cimetidine Hives        Medication List        Accurate as of February 24, 2021  9:40 AM. If you have any questions, ask your nurse or doctor.          methimazole 5 MG tablet Commonly known as: TAPAZOLE Take 1.5 tablets (7.5 mg total) by mouth daily.   PRENATAL VITAMIN PO Take by mouth.          OBJECTIVE:   PHYSICAL EXAM: VS: BP 114/68 (BP Location: Left Arm, Patient Position: Sitting, Cuff Size: Large)   Pulse 86   Ht 5\' 5"  (1.651 m)   Wt 279 lb 3.2 oz (126.6 kg)   LMP 07/28/2020   SpO2 96%   BMI 46.46 kg/m    EXAM: General: Pt appears well and is in NAD  Eyes: External eye exam normal without stare, lid lag  or exophthalmos.  EOM intact.    Neck: General: Supple without adenopathy. Thyroid: Thyroid size normal.  No goiter or nodules appreciated.   Lungs: Clear with good BS bilat with no rales, rhonchi, or wheezes  Heart: Auscultation: RRR.  Abdomen: Gravid uterus   Extremities:  BL LE: No pretibial edema normal ROM and strength.  Mental Status: Judgment, insight: Intact Orientation: Oriented to time, place, and person Mood and affect: No depression, anxiety, or agitation     DATA REVIEWED:   Results for ZAMIRA, HICKAM" (MRN [id number]) as of 02/26/2021 07:47  Ref. Range 02/24/2021 09:22  Sodium Latest Ref Range: 135 - 145 mEq/L 136  Potassium Latest Ref Range: 3.5 - 5.1 mEq/L 4.1  Chloride Latest Ref Range: 96 - 112 mEq/L 103  CO2 Latest Ref Range: 19 - 32 mEq/L 26  Glucose Latest Ref Range: 70 - 99  mg/dL 80  BUN Latest Ref Range: 6 - 23 mg/dL 6  Creatinine Latest Ref Range: 0.40 - 1.20 mg/dL 2.87  Calcium Latest Ref Range: 8.4 - 10.5 mg/dL 9.2  Alkaline Phosphatase Latest Ref Range: 39 - 117 U/L 86  Albumin Latest Ref Range: 3.5 - 5.2 g/dL 3.5  AST Latest Ref Range: 0 - 37 U/L 11  ALT Latest Ref Range: 0 - 35 U/L 13  Total Protein Latest Ref Range: 6.0 - 8.3 g/dL 6.5  Total Bilirubin Latest Ref Range: 0.2 - 1.2 mg/dL 0.3  GFR Latest Ref Range: >60.00 mL/min 122.46  WBC Latest Ref Range: 4.0 - 10.5 K/uL 9.9  RBC Latest Ref Range: 3.87 - 5.11 Mil/uL 4.49  Hemoglobin Latest Ref Range: 12.0 - 15.0 g/dL 68.1  HCT Latest Ref Range: 36.0 - 46.0 % 36.6  MCV Latest Ref Range: 78.0 - 100.0 fl 81.4  MCHC Latest Ref Range: 30.0 - 36.0 g/dL 15.7  RDW Latest Ref Range: 11.5 - 15.5 % 13.8  Platelets Latest Ref Range: 150.0 - 400.0 K/uL 219.0  Neutrophils Latest Ref Range: 43.0 - 77.0 % 76.0  Lymphocytes Latest Ref Range: 12.0 - 46.0 % 16.0  Monocytes Relative Latest Ref Range: 3.0 - 12.0 % 7.0  Eosinophil Latest Ref Range: 0.0 - 5.0 % 0.8  Basophil Latest Ref Range: 0.0 - 3.0 % 0.2  NEUT# Latest Ref Range: 1.4 - 7.7 K/uL 7.5  Lymphocyte # Latest Ref Range: 0.7 - 4.0 K/uL 1.6  Monocyte # Latest Ref Range: 0.1 - 1.0 K/uL 0.7  Eosinophils Absolute Latest Ref Range: 0.0 - 0.7 K/uL 0.1  Basophils Absolute Latest Ref Range: 0.0 - 0.1 K/uL 0.0  TSH Latest Units: mIU/L <0.01 (L)  Triiodothyronine (T3) Latest Ref Range: 76 - 181 ng/dL 262 (H)  M3,TDHR(CBULAG) Latest Ref Range: 0.60 - 1.60 ng/dL 5.36  Thyroxine (T4) Latest Ref Range: 5.1 - 11.9 mcg/dL 46.8 (H)  THYROID STIMULATING IMMUNOGLOBULIN Unknown Rpt     ASSESSMENT / PLAN / RECOMMENDATIONS:    Hyperthyroidism secondary to Graves' Disease:   - Has been diagnosed with Graves' disease since 2020 with her first pregnancy.  - She is clinically euthyroid  - The goal of treatment is to maintain persistent but mild hyperthyroidism in the  mother in an attempt to prevent fetal hypothyroidism since the fetal thyroid is more sensitive to the action of thionamide therapy. Overtreatment of maternal hyperthyroidism can cause fetal goiter and primary hypothyroidism.  - Repeat labs show T4 above goal we will increase methimazole as below -TR AB and TSI pending     Medications   Increase  methimazole 5 mg, 2 to tablets daily   2. Graves' Disease:   - No extra thyroidal manifestations of thyroid disease     3. Gestational Diabetes :   - Well controlled with diet alone  - Managed by Gyn  - Reviewed Glucose long, discussed pregnancy glucose goal with a fasting between 60-90 mg/DL and 2 hours postprandial less than 120 mg/DL    F/U in 3 months  Labs in 6 weeks    Addendum: Discussed lab results with pt on 11/25/2020  Signed electronically by: Lyndle Herrlich, MD  [facility] End[facility]  [facility] [address] [place] [place], [place] [address] Phone: [phone] FAX: [phone]      CC: Patient, No Pcp Per (Inactive) No address on file Phone: None  Fax: None   Return to Endocrinology clinic as below: No future appointments.

## 2021-02-25 ENCOUNTER — Ambulatory Visit: Payer: 59

## 2021-02-25 MED ORDER — METHIMAZOLE 5 MG PO TABS: 10.0000 mg | ORAL_TABLET | Freq: Every day | ORAL | 1 refills | Status: DC

## 2021-02-27 LAB — TRAB (TSH RECEPTOR BINDING ANTIBODY): TRAB: <1 IU/L (ref <=2.00)

## 2021-02-27 LAB — THYROID STIMULATING IMMUNOGLOBULIN

## 2021-02-27 LAB — TSH: TSH: <0.01 mIU/L — ABNORMAL LOW

## 2021-02-27 LAB — T3: T3, Total: 239 ng/dL — ABNORMAL HIGH (ref 76–181)

## 2021-02-27 LAB — T4: T4, Total: 17.9 ug/dL — ABNORMAL HIGH (ref 5.1–11.9)

## 2021-03-26 ENCOUNTER — Other Ambulatory Visit (INDEPENDENT_AMBULATORY_CARE_PROVIDER_SITE_OTHER): Payer: 59

## 2021-03-26 ENCOUNTER — Other Ambulatory Visit: Payer: Self-pay

## 2021-03-26 DIAGNOSIS — E0500 Thyrotoxicosis with diffuse goiter without thyrotoxic crisis or storm: Secondary | ICD-10-CM

## 2021-03-26 LAB — T4, FREE: Free T4: 0.81 ng/dL (ref 0.60–1.60)

## 2021-03-26 LAB — TSH: TSH: <0.01 u[IU]/mL — ABNORMAL LOW (ref 0.35–5.50)

## 2021-03-27 LAB — T3: T3, Total: 201 ng/dL — ABNORMAL HIGH (ref 76–181)

## 2021-03-27 LAB — T4: T4, Total: 15 ug/dL — ABNORMAL HIGH (ref 5.1–11.9)

## 2021-03-30 ENCOUNTER — Other Ambulatory Visit: Payer: Self-pay | Admitting: Obstetrics and Gynecology

## 2021-04-01 LAB — OB RESULTS CONSOLE GBS: GBS: NEGATIVE

## 2021-04-05 ENCOUNTER — Other Ambulatory Visit: Payer: Self-pay | Admitting: Endocrinology

## 2021-04-19 ENCOUNTER — Inpatient Hospital Stay (HOSPITAL_COMMUNITY)
Admission: AD | Admit: 2021-04-19 | Discharge: 2021-04-19 | Disposition: A | Discharge status: Home / Self Care | Payer: Managed Care, Other (non HMO) | Attending: Obstetrics & Gynecology | Admitting: Obstetrics & Gynecology

## 2021-04-19 ENCOUNTER — Encounter (HOSPITAL_COMMUNITY): Payer: Self-pay | Admitting: Obstetrics & Gynecology

## 2021-04-19 ENCOUNTER — Other Ambulatory Visit: Payer: Self-pay

## 2021-04-19 DIAGNOSIS — O471 False labor at or after 37 completed weeks of gestation: Secondary | ICD-10-CM | POA: Diagnosis not present

## 2021-04-19 DIAGNOSIS — O368130 Decreased fetal movements, third trimester, not applicable or unspecified: Secondary | ICD-10-CM | POA: Insufficient documentation

## 2021-04-19 DIAGNOSIS — Z3A38 38 weeks gestation of pregnancy: Secondary | ICD-10-CM | POA: Diagnosis not present

## 2021-04-19 DIAGNOSIS — Z3689 Encounter for other specified antenatal screening: Secondary | ICD-10-CM | POA: Diagnosis not present

## 2021-04-19 DIAGNOSIS — O479 False labor, unspecified: Secondary | ICD-10-CM

## 2021-04-19 LAB — URINALYSIS, ROUTINE W REFLEX MICROSCOPIC
Bilirubin Urine: NEGATIVE
Glucose, UA: NEGATIVE mg/dL
Hgb urine dipstick: NEGATIVE
Ketones, ur: NEGATIVE mg/dL
Leukocytes,Ua: NEGATIVE
Nitrite: NEGATIVE
Protein, ur: NEGATIVE mg/dL
Specific Gravity, Urine: 1.015 (ref 1.005–1.030)
pH: 6 (ref 5.0–8.0)

## 2021-04-19 NOTE — MAU Provider Note (Signed)
History     CSN: [id number]  Arrival date and time: 04/19/21 7846   Event Date/Time   First Provider Initiated Contact with Patient 04/19/21 1044      Chief Complaint  Patient presents with   Facial Swelling   Decreased Fetal Movement   HPI Madeline Meyer is a 30 y.o. G2P1001 at [redacted]w[redacted]d who presents with decreased fetal movement. She states for a few hours this morning she hasn't felt the baby move as normal. She reports normal movement since arrival MAU. She also reports when she lays on her right side at night, she feels sore in her shoulder and ribs. She does not have that pain now. She reports some tightening in her abdomen last night with one episode of diarrhea, all of which has resolved now. She reports her hands, feet and face feel more swollen today but it has improved as well. She denies any bleeding or leaking. She denies any pain at this time. She reports a headache last night so she took her BP and it was 120/88. She is concerned that she may have preeclampsia  OB History     Gravida  2   Para  1   Term  1   Preterm      AB      Living  1      SAB      IAB      Ectopic      Multiple      Live Births  1           Past Medical History:  Diagnosis Date   Alcohol abuse    Graves disease    Hyperthyroidism    Lyme disease    Uterine polyp     Past Surgical History:  Procedure Laterality Date   CESAREAN SECTION     DILATION AND CURETTAGE OF UTERUS      Family History  Problem Relation Age of Onset   Graves' disease Maternal Grandmother     Social History   Tobacco Use   Smoking status: Never   Smokeless tobacco: Never  Vaping Use   Vaping Use: Never used  Substance Use Topics   Alcohol use: Not Currently   Drug use: Never    Allergies:  Allergies  Allergen Reactions   Cimetidine Hives    Medications Prior to Admission  Medication Sig Dispense Refill Last Dose   methimazole (TAPAZOLE) 5 MG tablet Take 1.5 tablets (7.5  mg total) by mouth daily. 135 tablet 1    Prenatal Vit-Fe Fumarate-FA (PRENATAL VITAMIN PO) Take by mouth.       Review of Systems  Constitutional: Negative.  Negative for fatigue and fever.  HENT: Negative.    Respiratory: Negative.  Negative for shortness of breath.   Cardiovascular: Negative.  Negative for chest pain.  Gastrointestinal: Negative.  Negative for abdominal pain, constipation, diarrhea, nausea and vomiting.  Genitourinary: Negative.  Negative for dysuria, vaginal bleeding and vaginal discharge.  Neurological: Negative.  Negative for dizziness and headaches.  Physical Exam   Blood pressure 104/69, pulse 80, temperature 98 F (36.7 C), temperature source Oral, resp. rate 16, height 5\' 6"  (1.676 m), weight 128.8 kg, last menstrual period 07/28/2020, SpO2 95 %.  Patient Vitals for the past 24 hrs:  BP Temp Temp src Pulse Resp SpO2 Height Weight  04/19/21 1116 (!) 108/53 -- -- 67 -- -- -- --  04/19/21 1035 104/69 98 F (36.7 C) Oral 80 16 95 % -- --  04/19/21 1034 -- -- -- -- -- -- 5\' 6"  (1.676 m) 128.8 kg    Physical Exam Vitals and nursing note reviewed.  Constitutional:      General: She is not in acute distress.    Appearance: She is well-developed.  HENT:     Head: Normocephalic.  Eyes:     Pupils: Pupils are equal, round, and reactive to light.  Cardiovascular:     Rate and Rhythm: Normal rate and regular rhythm.     Heart sounds: Normal heart sounds.  Pulmonary:     Effort: Pulmonary effort is normal. No respiratory distress.     Breath sounds: Normal breath sounds.  Abdominal:     General: Bowel sounds are normal. There is no distension.     Palpations: Abdomen is soft.     Tenderness: There is no abdominal tenderness.  Skin:    General: Skin is warm and dry.  Neurological:     Mental Status: She is alert and oriented to person, place, and time.  Psychiatric:        Mood and Affect: Mood normal.        Behavior: Behavior normal.        Thought  Content: Thought content normal.        Judgment: Judgment normal.    Fetal Tracing:  Baseline: 120 Variability: moderate Accels: 15x15 Decels: none  Toco: none  Dilation: Closed (externally 2cm) Effacement (%): Thick Presentation: Undeterminable Exam by:: [email or web address] RN   MAU Course  Procedures Results for orders placed or performed during the hospital encounter of 04/19/21 (from the past 24 hour(s))  Urinalysis, Routine w reflex microscopic Urine, Clean Catch     Status: None   Collection Time: 04/19/21 10:23 AM  Result Value Ref Range   Color, Urine YELLOW YELLOW   APPearance CLEAR CLEAR   Specific Gravity, Urine 1.015 1.005 - 1.030   pH 6.0 5.0 - 8.0   Glucose, UA NEGATIVE NEGATIVE mg/dL   Hgb urine dipstick NEGATIVE NEGATIVE   Bilirubin Urine NEGATIVE NEGATIVE   Ketones, ur NEGATIVE NEGATIVE mg/dL   Protein, ur NEGATIVE NEGATIVE mg/dL   Nitrite NEGATIVE NEGATIVE   Leukocytes,Ua NEGATIVE NEGATIVE    MDM UA Offered preeclampsia work up for patient peace of mind although no suspicion of preeclampsia at this time- patient declined Patient requested cervical exam Patient reports normal movement in MAU with reactive NST  Assessment and Plan   1. NST (non-stress test) reactive   2. False labor   3. [redacted] weeks gestation of pregnancy    -Discharge home in stable condition -Labor precautions discussed -Patient advised to follow-up with OB as scheduled for prenatal care -Patient may return to MAU as needed or if her condition were to change or worsen   04/21/21 CNM 04/19/2021, 10:44 AM

## 2021-04-19 NOTE — MAU Note (Signed)
Madeline Meyer is a 30 y.o. at [redacted]w[redacted]d here in MAU reporting: for the past few days has been having some right shoulder pain and at night her right rib cage has been hurting. Noticed some increased swelling in hands, feet, and face. No bleeding or LOF. DFM  Onset of complaint: ongoing  Pain score: 0/10  Vitals:   04/19/21 1035  BP: 104/69  Pulse: 80  Resp: 16  Temp: 98 F (36.7 C)  SpO2: 95%     FHT: EFM applied in room  Lab orders placed from triage: UA

## 2021-04-19 NOTE — Discharge Instructions (Signed)

## 2021-04-22 ENCOUNTER — Encounter (HOSPITAL_COMMUNITY): Payer: Self-pay

## 2021-04-22 NOTE — Patient Instructions (Addendum)
Madeline Meyer  04/22/2021   Your procedure is scheduled on:  04/29/2021  Arrive at 0530 at Entrance C on [address] at [facility]  and [employer]. You are invited to use the FREE valet parking or use the Visitor's parking deck.  Pick up the phone at the desk and dial [phone].  Call this number if you have problems the morning of surgery: [phone]  Remember:   Do not eat food:(After Midnight) Desps de medianoche.  Do not drink clear liquids: (After Midnight) Desps de medianoche.  Take these medicines the morning of surgery with A SIP OF WATER: methimazole as prescribed    Do not wear jewelry, make-up or nail polish.  Do not wear lotions, powders, or perfumes. Do not wear deodorant.  Do not shave 48 hours prior to surgery.  Do not bring valuables to the hospital.  [facility] is not   responsible for any belongings or valuables brought to the hospital.  Contacts, dentures or bridgework may not be worn into surgery.  Leave suitcase in the car. After surgery it may be brought to your room.  For patients admitted to the hospital, checkout time is 11:00 AM the day of              discharge.      Please read over the following fact sheets that you were given:     Preparing for Surgery

## 2021-04-27 ENCOUNTER — Other Ambulatory Visit: Payer: Self-pay | Admitting: Obstetrics and Gynecology

## 2021-04-27 ENCOUNTER — Encounter (HOSPITAL_COMMUNITY)
Admission: RE | Admit: 2021-04-27 | Discharge: 2021-04-27 | Disposition: A | Discharge status: Home / Self Care | Payer: 59 | Source: Ambulatory Visit | Attending: Obstetrics and Gynecology | Admitting: Obstetrics and Gynecology

## 2021-04-27 HISTORY — DX: Gestational diabetes mellitus in pregnancy, unspecified control: O24.419

## 2021-04-27 NOTE — Patient Instructions (Addendum)
Kyeshia Zinn  04/27/2021   Your procedure is scheduled on:  04/29/2021  Arrive at 0500 at [employer] C on [address] at [facility]  and [employer]. You are invited to use the FREE valet parking or use the Visitor's parking deck.  Pick up the phone at the desk and dial [phone].  Call this number if you have problems the morning of surgery: [phone]  Remember:   Do not eat food:(After Midnight) Desps de medianoche.  Do not drink clear liquids: (After Midnight) Desps de medianoche.  Take these medicines the morning of surgery with A SIP OF WATER:  Take methimazole as prescribed   Do not wear jewelry, make-up or nail polish.  Do not wear lotions, powders, or perfumes. Do not wear deodorant.  Do not shave 48 hours prior to surgery.  Do not bring valuables to the hospital.  [facility] is not   responsible for any belongings or valuables brought to the hospital.  Contacts, dentures or bridgework may not be worn into surgery.  Leave suitcase in the car. After surgery it may be brought to your room.  For patients admitted to the hospital, checkout time is 11:00 AM the day of              discharge.      Please read over the following fact sheets that you were given:     Preparing for Surgery

## 2021-04-28 ENCOUNTER — Encounter (HOSPITAL_COMMUNITY): Payer: Self-pay

## 2021-04-28 ENCOUNTER — Encounter (HOSPITAL_COMMUNITY): Payer: Self-pay | Admitting: Obstetrics and Gynecology

## 2021-04-28 ENCOUNTER — Inpatient Hospital Stay (HOSPITAL_COMMUNITY)
Admission: RE | Admit: 2021-04-28 | Discharge: 2021-04-28 | Disposition: A | Discharge status: Home / Self Care | Payer: Managed Care, Other (non HMO) | Source: Ambulatory Visit | Attending: Obstetrics and Gynecology | Admitting: Obstetrics and Gynecology

## 2021-04-28 ENCOUNTER — Inpatient Hospital Stay (HOSPITAL_COMMUNITY)
Admission: AD | Admit: 2021-04-28 | Payer: Managed Care, Other (non HMO) | Source: Home / Self Care | Admitting: Obstetrics and Gynecology

## 2021-04-28 ENCOUNTER — Other Ambulatory Visit: Payer: Self-pay

## 2021-04-28 LAB — SARS CORONAVIRUS 2 (TAT 6-24 HRS): SARS Coronavirus 2: POSITIVE — AB

## 2021-04-28 NOTE — Anesthesia Preprocedure Evaluation (Addendum)
Anesthesia Evaluation  Patient identified by MRN, date of birth, ID band Patient awake    Reviewed: Allergy & Precautions, NPO status , Patient's Chart, lab work & pertinent test results  Airway Mallampati: III  TM Distance: >3 FB Neck ROM: Full    Dental no notable dental hx. (+) Teeth Intact, Dental Advisory Given   Pulmonary  Covid + 10/24 preop, asymptomatic   Pulmonary exam normal breath sounds clear to auscultation       Cardiovascular negative cardio ROS Normal cardiovascular exam Rhythm:Regular Rate:Normal     Neuro/Psych negative neurological ROS  negative psych ROS   GI/Hepatic negative GI ROS, Hx/o ETOH abuse   Endo/Other  diabetes, Well Controlled, GestationalHyperthyroidism Morbid obesityGrave's disease  Renal/GU negative Renal ROS  negative genitourinary   Musculoskeletal   Abdominal (+) + obese,   Peds  Hematology Hx/o Lyme disease   Anesthesia Other Findings   Reproductive/Obstetrics (+) Pregnancy Previous C/Section                            Anesthesia Physical Anesthesia Plan  ASA: 3  Anesthesia Plan: Spinal   Post-op Pain Management:    Induction: Intravenous  PONV Risk Score and Plan: 4 or greater and Treatment may vary due to age or medical condition and Scopolamine patch - Pre-op  Airway Management Planned: Natural Airway  Additional Equipment:   Intra-op Plan:   Post-operative Plan:   Informed Consent: I have reviewed the patients History and Physical, chart, labs and discussed the procedure including the risks, benefits and alternatives for the proposed anesthesia with the patient or authorized representative who has indicated his/her understanding and acceptance.     Dental advisory given  Plan Discussed with: CRNA and Anesthesiologist  Anesthesia Plan Comments:        Anesthesia Quick Evaluation

## 2021-04-29 ENCOUNTER — Inpatient Hospital Stay (HOSPITAL_COMMUNITY)
Admission: RE | Admit: 2021-04-29 | Discharge: 2021-05-01 | DRG: 786 | Disposition: A | Discharge status: Home / Self Care | Payer: Managed Care, Other (non HMO) | Attending: Obstetrics and Gynecology | Admitting: Obstetrics and Gynecology

## 2021-04-29 ENCOUNTER — Encounter (HOSPITAL_COMMUNITY): Payer: Self-pay | Admitting: Obstetrics and Gynecology

## 2021-04-29 ENCOUNTER — Inpatient Hospital Stay (HOSPITAL_COMMUNITY): Payer: Managed Care, Other (non HMO) | Admitting: Anesthesiology

## 2021-04-29 ENCOUNTER — Encounter (HOSPITAL_COMMUNITY)
Admission: RE | Disposition: A | Discharge status: Home / Self Care | Payer: Self-pay | Source: Home / Self Care | Attending: Obstetrics and Gynecology

## 2021-04-29 ENCOUNTER — Other Ambulatory Visit: Payer: Self-pay

## 2021-04-29 DIAGNOSIS — Z3A39 39 weeks gestation of pregnancy: Secondary | ICD-10-CM | POA: Diagnosis not present

## 2021-04-29 DIAGNOSIS — O24420 Gestational diabetes mellitus in childbirth, diet controlled: Secondary | ICD-10-CM | POA: Diagnosis present

## 2021-04-29 DIAGNOSIS — O9081 Anemia of the puerperium: Secondary | ICD-10-CM | POA: Diagnosis not present

## 2021-04-29 DIAGNOSIS — O34211 Maternal care for low transverse scar from previous cesarean delivery: Principal | ICD-10-CM | POA: Diagnosis present

## 2021-04-29 DIAGNOSIS — U071 COVID-19: Secondary | ICD-10-CM | POA: Diagnosis present

## 2021-04-29 DIAGNOSIS — E0590 Thyrotoxicosis, unspecified without thyrotoxic crisis or storm: Secondary | ICD-10-CM | POA: Diagnosis present

## 2021-04-29 DIAGNOSIS — O99214 Obesity complicating childbirth: Secondary | ICD-10-CM | POA: Diagnosis present

## 2021-04-29 DIAGNOSIS — O9902 Anemia complicating childbirth: Secondary | ICD-10-CM | POA: Diagnosis present

## 2021-04-29 DIAGNOSIS — Z98891 History of uterine scar from previous surgery: Secondary | ICD-10-CM

## 2021-04-29 DIAGNOSIS — O9852 Other viral diseases complicating childbirth: Secondary | ICD-10-CM | POA: Diagnosis present

## 2021-04-29 DIAGNOSIS — D62 Acute posthemorrhagic anemia: Secondary | ICD-10-CM | POA: Diagnosis not present

## 2021-04-29 DIAGNOSIS — O99284 Endocrine, nutritional and metabolic diseases complicating childbirth: Secondary | ICD-10-CM | POA: Diagnosis present

## 2021-04-29 DIAGNOSIS — O24419 Gestational diabetes mellitus in pregnancy, unspecified control: Secondary | ICD-10-CM | POA: Diagnosis present

## 2021-04-29 LAB — CBC
HCT: 36.8 % (ref 36.0–46.0)
Hemoglobin: 11.6 g/dL — ABNORMAL LOW (ref 12.0–15.0)
MCH: 25.4 pg — ABNORMAL LOW (ref 26.0–34.0)
MCHC: 31.5 g/dL (ref 30.0–36.0)
MCV: 80.5 fL (ref 80.0–100.0)
Platelets: 240 10*3/uL (ref 150–400)
RBC: 4.57 MIL/uL (ref 3.87–5.11)
RDW: 14.2 % (ref 11.5–15.5)
WBC: 14 10*3/uL — ABNORMAL HIGH (ref 4.0–10.5)
nRBC: 0 % (ref 0.0–0.2)

## 2021-04-29 LAB — TYPE AND SCREEN
ABO/RH(D): O POS
Antibody Screen: NEGATIVE

## 2021-04-29 LAB — RPR: RPR Ser Ql: NONREACTIVE

## 2021-04-29 LAB — GLUCOSE, CAPILLARY: Glucose-Capillary: 84 mg/dL (ref 70–99)

## 2021-04-29 SURGERY — Surgical Case
Anesthesia: Spinal

## 2021-04-29 MED ORDER — PHENYLEPHRINE HCL-NACL 20-0.9 MG/250ML-% IV SOLN
INTRAVENOUS | Status: AC
  Filled 2021-04-29: qty 250

## 2021-04-29 MED ORDER — DIPHENHYDRAMINE HCL 25 MG PO CAPS: 25.0000 mg | ORAL_CAPSULE | ORAL | Status: DC | PRN

## 2021-04-29 MED ORDER — BUPIVACAINE IN DEXTROSE 0.75-8.25 % IT SOLN
INTRATHECAL | Status: DC | PRN
  Administered 2021-04-29: 1.7 mg via INTRATHECAL

## 2021-04-29 MED ORDER — FENTANYL CITRATE (PF) 100 MCG/2ML IJ SOLN: 25.0000 ug | INTRAMUSCULAR | Status: DC | PRN

## 2021-04-29 MED ORDER — ZOLPIDEM TARTRATE 5 MG PO TABS: 5.0000 mg | ORAL_TABLET | Freq: Every evening | ORAL | Status: DC | PRN

## 2021-04-29 MED ORDER — FENTANYL CITRATE (PF) 100 MCG/2ML IJ SOLN
INTRAMUSCULAR | Status: AC
  Filled 2021-04-29: qty 2

## 2021-04-29 MED ORDER — NALBUPHINE HCL 10 MG/ML IJ SOLN: 5.0000 mg | INTRAMUSCULAR | Status: DC | PRN

## 2021-04-29 MED ORDER — SIMETHICONE 80 MG PO CHEW
80.0000 mg | CHEWABLE_TABLET | Freq: Three times a day (TID) | ORAL | Status: DC
  Administered 2021-04-29 – 2021-05-01 (×5): 80 mg via ORAL
  Filled 2021-04-29 (×5): qty 1

## 2021-04-29 MED ORDER — CEFAZOLIN SODIUM-DEXTROSE 2-4 GM/100ML-% IV SOLN
INTRAVENOUS | Status: AC
  Filled 2021-04-29: qty 100

## 2021-04-29 MED ORDER — IBUPROFEN 600 MG PO TABS
600.0000 mg | ORAL_TABLET | Freq: Four times a day (QID) | ORAL | Status: DC
  Administered 2021-04-29 – 2021-05-01 (×5): 600 mg via ORAL
  Filled 2021-04-29 (×6): qty 1

## 2021-04-29 MED ORDER — DIPHENHYDRAMINE HCL 50 MG/ML IJ SOLN: 12.5000 mg | INTRAMUSCULAR | Status: DC | PRN

## 2021-04-29 MED ORDER — MORPHINE SULFATE (PF) 0.5 MG/ML IJ SOLN
INTRAMUSCULAR | Status: DC | PRN
  Administered 2021-04-29: .15 mg via INTRATHECAL

## 2021-04-29 MED ORDER — OXYTOCIN-SODIUM CHLORIDE 30-0.9 UT/500ML-% IV SOLN
INTRAVENOUS | Status: AC
  Filled 2021-04-29: qty 500

## 2021-04-29 MED ORDER — MORPHINE SULFATE (PF) 0.5 MG/ML IJ SOLN
INTRAMUSCULAR | Status: AC
  Filled 2021-04-29: qty 10

## 2021-04-29 MED ORDER — WITCH HAZEL-GLYCERIN EX PADS: 1.0000 "application " | MEDICATED_PAD | CUTANEOUS | Status: DC | PRN

## 2021-04-29 MED ORDER — PHENYLEPHRINE HCL-NACL 20-0.9 MG/250ML-% IV SOLN
INTRAVENOUS | Status: DC | PRN
  Administered 2021-04-29: 60 ug/min via INTRAVENOUS

## 2021-04-29 MED ORDER — METHIMAZOLE 10 MG PO TABS
10.0000 mg | ORAL_TABLET | Freq: Every morning | ORAL | Status: DC
  Administered 2021-04-30 – 2021-05-01 (×2): 10 mg via ORAL
  Filled 2021-04-29 (×2): qty 1

## 2021-04-29 MED ORDER — PRENATAL MULTIVITAMIN CH
1.0000 | ORAL_TABLET | Freq: Every day | ORAL | Status: DC
  Administered 2021-04-30 – 2021-05-01 (×2): 1 via ORAL
  Filled 2021-04-29 (×2): qty 1

## 2021-04-29 MED ORDER — NALOXONE HCL 4 MG/10ML IJ SOLN
1.0000 ug/kg/h | INTRAVENOUS | Status: DC | PRN
  Filled 2021-04-29: qty 5

## 2021-04-29 MED ORDER — ONDANSETRON HCL 4 MG/2ML IJ SOLN: 4.0000 mg | Freq: Three times a day (TID) | INTRAMUSCULAR | Status: DC | PRN

## 2021-04-29 MED ORDER — OXYTOCIN-SODIUM CHLORIDE 30-0.9 UT/500ML-% IV SOLN: 2.5000 [IU]/h | INTRAVENOUS | Status: AC

## 2021-04-29 MED ORDER — ACETAMINOPHEN 10 MG/ML IV SOLN
INTRAVENOUS | Status: DC | PRN
  Administered 2021-04-29: 1000 mg via INTRAVENOUS

## 2021-04-29 MED ORDER — CEFAZOLIN SODIUM-DEXTROSE 2-4 GM/100ML-% IV SOLN
2.0000 g | INTRAVENOUS | Status: AC
  Administered 2021-04-29: 3 g via INTRAVENOUS

## 2021-04-29 MED ORDER — ONDANSETRON HCL 4 MG/2ML IJ SOLN
INTRAMUSCULAR | Status: AC
  Filled 2021-04-29: qty 2

## 2021-04-29 MED ORDER — FENTANYL CITRATE (PF) 100 MCG/2ML IJ SOLN
INTRAMUSCULAR | Status: DC | PRN
  Administered 2021-04-29: 15 ug via INTRATHECAL

## 2021-04-29 MED ORDER — KETOROLAC TROMETHAMINE 30 MG/ML IJ SOLN
30.0000 mg | Freq: Four times a day (QID) | INTRAMUSCULAR | Status: AC
  Filled 2021-04-29: qty 1

## 2021-04-29 MED ORDER — OXYTOCIN-SODIUM CHLORIDE 30-0.9 UT/500ML-% IV SOLN
INTRAVENOUS | Status: DC | PRN
  Administered 2021-04-29: 200 mL via INTRAVENOUS

## 2021-04-29 MED ORDER — SENNOSIDES-DOCUSATE SODIUM 8.6-50 MG PO TABS
2.0000 | ORAL_TABLET | Freq: Every day | ORAL | Status: DC
  Administered 2021-04-30 – 2021-05-01 (×2): 2 via ORAL
  Filled 2021-04-29 (×2): qty 2

## 2021-04-29 MED ORDER — ATROPINE SULFATE 0.4 MG/ML IV SOLN
INTRAVENOUS | Status: AC
  Filled 2021-04-29: qty 1

## 2021-04-29 MED ORDER — NALBUPHINE HCL 10 MG/ML IJ SOLN: 5.0000 mg | Freq: Once | INTRAMUSCULAR | Status: DC | PRN

## 2021-04-29 MED ORDER — COCONUT OIL OIL: 1.0000 "application " | TOPICAL_OIL | Status: DC | PRN

## 2021-04-29 MED ORDER — KETOROLAC TROMETHAMINE 30 MG/ML IJ SOLN
INTRAMUSCULAR | Status: AC
  Filled 2021-04-29: qty 1

## 2021-04-29 MED ORDER — DIPHENHYDRAMINE HCL 25 MG PO CAPS: 25.0000 mg | ORAL_CAPSULE | Freq: Four times a day (QID) | ORAL | Status: DC | PRN

## 2021-04-29 MED ORDER — HYDROMORPHONE HCL 1 MG/ML IJ SOLN: 0.2000 mg | INTRAMUSCULAR | Status: DC | PRN

## 2021-04-29 MED ORDER — ONDANSETRON HCL 4 MG/2ML IJ SOLN
INTRAMUSCULAR | Status: DC | PRN
  Administered 2021-04-29: 4 mg via INTRAVENOUS

## 2021-04-29 MED ORDER — AMMONIA AROMATIC IN INHA
RESPIRATORY_TRACT | Status: AC
  Filled 2021-04-29: qty 40

## 2021-04-29 MED ORDER — ATROPINE SULFATE 0.4 MG/ML IV SOLN
INTRAVENOUS | Status: DC | PRN
  Administered 2021-04-29: .4 mg via INTRAVENOUS

## 2021-04-29 MED ORDER — MEPERIDINE HCL 25 MG/ML IJ SOLN: 6.2500 mg | INTRAMUSCULAR | Status: DC | PRN

## 2021-04-29 MED ORDER — OXYCODONE HCL 5 MG PO TABS: 5.0000 mg | ORAL_TABLET | ORAL | Status: DC | PRN

## 2021-04-29 MED ORDER — ACETAMINOPHEN 500 MG PO TABS
1000.0000 mg | ORAL_TABLET | Freq: Four times a day (QID) | ORAL | Status: DC
  Administered 2021-04-29 – 2021-05-01 (×7): 1000 mg via ORAL
  Filled 2021-04-29 (×7): qty 2

## 2021-04-29 MED ORDER — KETOROLAC TROMETHAMINE 30 MG/ML IJ SOLN
30.0000 mg | Freq: Four times a day (QID) | INTRAMUSCULAR | Status: AC | PRN
  Administered 2021-04-29: 30 mg via INTRAVENOUS

## 2021-04-29 MED ORDER — SIMETHICONE 80 MG PO CHEW: 80.0000 mg | CHEWABLE_TABLET | ORAL | Status: DC | PRN

## 2021-04-29 MED ORDER — EPHEDRINE 5 MG/ML INJ
INTRAVENOUS | Status: AC
  Filled 2021-04-29: qty 5

## 2021-04-29 MED ORDER — LACTATED RINGERS IV SOLN: INTRAVENOUS | Status: DC

## 2021-04-29 MED ORDER — SODIUM CHLORIDE 0.9% FLUSH: 3.0000 mL | INTRAVENOUS | Status: DC | PRN

## 2021-04-29 MED ORDER — DIBUCAINE (PERIANAL) 1 % EX OINT: 1.0000 "application " | TOPICAL_OINTMENT | CUTANEOUS | Status: DC | PRN

## 2021-04-29 MED ORDER — NALOXONE HCL 0.4 MG/ML IJ SOLN: 0.4000 mg | INTRAMUSCULAR | Status: DC | PRN

## 2021-04-29 MED ORDER — SCOPOLAMINE 1 MG/3DAYS TD PT72
1.0000 | MEDICATED_PATCH | TRANSDERMAL | Status: DC
  Administered 2021-04-29: 1.5 mg via TRANSDERMAL

## 2021-04-29 MED ORDER — POVIDONE-IODINE 10 % EX SWAB: 2.0000 "application " | Freq: Once | CUTANEOUS | Status: DC

## 2021-04-29 MED ORDER — ACETAMINOPHEN 10 MG/ML IV SOLN
INTRAVENOUS | Status: AC
  Filled 2021-04-29: qty 100

## 2021-04-29 MED ORDER — SCOPOLAMINE 1 MG/3DAYS TD PT72
MEDICATED_PATCH | TRANSDERMAL | Status: AC
  Filled 2021-04-29: qty 1

## 2021-04-29 MED ORDER — TETANUS-DIPHTH-ACELL PERTUSSIS 5-2.5-18.5 LF-MCG/0.5 IM SUSY: 0.5000 mL | PREFILLED_SYRINGE | Freq: Once | INTRAMUSCULAR | Status: DC

## 2021-04-29 MED ORDER — MENTHOL 3 MG MT LOZG: 1.0000 | LOZENGE | OROMUCOSAL | Status: DC | PRN

## 2021-04-29 MED ORDER — EPHEDRINE SULFATE-NACL 50-0.9 MG/10ML-% IV SOSY
PREFILLED_SYRINGE | INTRAVENOUS | Status: DC | PRN
  Administered 2021-04-29: 10 mg via INTRAVENOUS

## 2021-04-29 MED ORDER — KETOROLAC TROMETHAMINE 30 MG/ML IJ SOLN
30.0000 mg | Freq: Four times a day (QID) | INTRAMUSCULAR | Status: AC | PRN
Start: 2021-04-29 — End: 2021-04-30
  Administered 2021-04-29: 30 mg via INTRAMUSCULAR

## 2021-04-29 SURGICAL SUPPLY — 31 items
BENZOIN TINCTURE PRP APPL 2/3 (GAUZE/BANDAGES/DRESSINGS) ×2 IMPLANT
CHLORAPREP W/TINT 26ML (MISCELLANEOUS) ×2 IMPLANT
CLAMP CORD UMBIL (MISCELLANEOUS) IMPLANT
CLOTH BEACON ORANGE TIMEOUT ST (SAFETY) ×2 IMPLANT
DRSG OPSITE POSTOP 4X10 (GAUZE/BANDAGES/DRESSINGS) ×2 IMPLANT
ELECT REM PT RETURN 9FT ADLT (ELECTROSURGICAL) ×2
ELECTRODE REM PT RTRN 9FT ADLT (ELECTROSURGICAL) ×1 IMPLANT
EXTRACTOR VACUUM KIWI (MISCELLANEOUS) IMPLANT
GLOVE BIOGEL PI IND STRL 7.0 (GLOVE) ×3 IMPLANT
GLOVE BIOGEL PI INDICATOR 7.0 (GLOVE) ×3
GLOVE ECLIPSE 6.5 STRL STRAW (GLOVE) ×2 IMPLANT
GOWN STRL REUS W/TWL LRG LVL3 (GOWN DISPOSABLE) ×4 IMPLANT
KIT ABG SYR 3ML LUER SLIP (SYRINGE) IMPLANT
LIGASURE IMPACT 36 18CM CVD LR (INSTRUMENTS) ×2 IMPLANT
NEEDLE HYPO 25X5/8 SAFETYGLIDE (NEEDLE) IMPLANT
NS IRRIG 1000ML POUR BTL (IV SOLUTION) ×2 IMPLANT
PACK C SECTION WH (CUSTOM PROCEDURE TRAY) ×2 IMPLANT
PAD OB MATERNITY 4.3X12.25 (PERSONAL CARE ITEMS) ×2 IMPLANT
STRIP CLOSURE SKIN 1/2X4 (GAUZE/BANDAGES/DRESSINGS) IMPLANT
STRIP SURGICAL 1/2 X 6 IN (GAUZE/BANDAGES/DRESSINGS) ×2 IMPLANT
SUT MNCRL 0 VIOLET CTX 36 (SUTURE) ×2 IMPLANT
SUT MONOCRYL 0 CTX 36 (SUTURE) ×2
SUT PLAIN 0 NONE (SUTURE) IMPLANT
SUT PLAIN 2 0 (SUTURE) ×1
SUT PLAIN ABS 2-0 CT1 27XMFL (SUTURE) ×1 IMPLANT
SUT VIC AB 0 CT1 27 (SUTURE) ×1
SUT VIC AB 0 CT1 27XBRD ANBCTR (SUTURE) ×1 IMPLANT
SUT VIC AB 4-0 KS 27 (SUTURE) ×2 IMPLANT
TOWEL OR 17X24 6PK STRL BLUE (TOWEL DISPOSABLE) ×2 IMPLANT
TRAY FOLEY W/BAG SLVR 14FR LF (SET/KITS/TRAYS/PACK) IMPLANT
WATER STERILE IRR 1000ML POUR (IV SOLUTION) ×2 IMPLANT

## 2021-04-29 NOTE — Anesthesia Procedure Notes (Signed)
Spinal  Patient location during procedure: OR Start time: 04/29/2021 8:04 AM End time: 04/29/2021 8:08 AM Reason for block: surgical anesthesia Staffing Performed: anesthesiologist  Anesthesiologist: Mal Amabile, MD Preanesthetic Checklist Completed: patient identified, IV checked, site marked, risks and benefits discussed, surgical consent, monitors and equipment checked, pre-op evaluation and timeout performed Spinal Block Patient position: sitting Prep: DuraPrep and site prepped and draped Patient monitoring: heart rate, cardiac monitor, continuous pulse ox and blood pressure Approach: midline Location: L3-4 Injection technique: single-shot Needle Needle type: Pencan  Needle gauge: 24 G Needle length: 9 cm Needle insertion depth: 8 cm Assessment Sensory level: T4 Events: CSF return Additional Notes Patient tolerated procedure well. Adequate sensory level.

## 2021-04-29 NOTE — Progress Notes (Signed)
Pts IV pulled out accidentally. Tape not adhering due to sweaty skin. Efforts made earlier to reinforce dsg. Catheter intact.

## 2021-04-29 NOTE — Lactation Note (Signed)
This note was copied from a baby's chart. Lactation Consultation Note  Patient Name: Madeline Meyer [id number] Date: 04/29/2021 Reason for consult: Initial assessment Age:32 hours  P1 Mother reports that she breastfed her first child for 1 month. When ask if she would like help with hand expression  she declined. Infant latched on for a few sucks on and off. Mother has infant in football hold. Mother reports that she was going to change infants position. Mother declined assistance. Offered to visit mother every day if she would like . Mother reports ok to visit.  Encouraged mother to cue base feed and discussed cluster feeding  after 24 hours of life.  Encouraged to do frequent STS.  Maternal Data    Feeding Mother's Current Feeding Choice: Breast Milk  LATCH Score Latch: Repeated attempts needed to sustain latch, nipple held in mouth throughout feeding, stimulation needed to elicit sucking reflex.  Audible Swallowing: A few with stimulation  Type of Nipple: Everted at rest and after stimulation  Comfort (Breast/Nipple): Soft / non-tender  Hold (Positioning): Assistance needed to correctly position infant at breast and maintain latch.  LATCH Score: 7   Lactation Tools Discussed/Used    Interventions Interventions: Breast feeding basics reviewed;Assisted with latch;Skin to skin;Adjust position;Position options;LC Services brochure;Education;Support pillows  Discharge    Consult Status Consult Status: Follow-up Date: 04/30/21 Follow-up type: In-patient    Stevan Born [facility] 04/29/2021, 3:21 PM

## 2021-04-29 NOTE — Op Note (Signed)
Madeline Meyer 01/11/1991 [id number]   OPERATIVE NOTE  PROCEDURE: repeat low transverse cesarean section  PRE-OPERATIVE DIAGNOSIS:  Single intrauterine pregnancy at 39 weeks 5 days History of previous cesarean section, desires repeat  POST-OPERATIVE DIAGNOSIS: Single intrauterine pregnancy at 39 weeks 5 days, delivered History of previous cesarean section, desires repeat Intraabdominal adhesions  SURGEON: Clance Boll, DO  ASSISTANT: Dorisann Frames, CNM  FINDINGS: single viable healthy female infant in the vertex presentation ROT apgars 8,9 weight pending. Significant adhesive disease noted between the fascia, perineum, and underlying omentum, otherwise normal appearing gravid uterus and bilateral fallopian tubes and ovaries  EBL: 210 cc  FLUIDS: 850 cc  URINE OUTPUT:  200 cc clear  COMPLICATIONS: none  PROCEDURE IN DETAIL:  After the patient was appropriately consented she was taken to the operating room where regional anesthesia was obtained without complications. The patient was placed in the dorsal supine position with leftward tilt. Fetal heart tones were obtained and found to be reassuring. Madeline Foley catheter was placed and the bladder was drained for clear yellow urine and remained in place for the duration of the procedure. The patient was prepped and draped in the usual sterile fashion. An appropriate time out was performed that verified the correct patient, procedure, and surgical team.   The scalpel was used to make Madeline low transverse skin incision. The incision was carried down to the fascia, maintaining hemostasis with the Bovie as needed, The fascia was incised to the left and the right of the midline. Significant amount of adhesions were noted at this point at the midline. Careful dissection performed sharply with the scalpel. . The inferior aspect of the fascia was grasped with the Kocher clamps, tented upwards, and the underlying rectus muscle dissected off bluntly and  sharply with curved Mayo scissors. The Kocher clamps were removed, placed on the superior aspect of the fascia and the rectus muscles dissected off in Madeline similar fashion. The rectus muscles were divided at the midline with careful sharp dissection. The peritoneum was identified and entered bluntly. The incision was then extended laterally by bluntly stretching and careful sharp dissection. The Alexis retractor was introduced. The vesicouterine peritoneum was dissected off the lower uterine segment and Madeline bladder flap was created digitally. The scalpel was used to make Madeline low transverse uterine incision. The incision was extended caudad and cephalad by bluntly stretching. The amniotic membranes were ruptured for clear fluid. The infant was found to be in the cephalic presentation. The infant's head was delivered through the hysterotomy while maintaining adequate flexion at the neck. The infant's remaining shoulders and body were subsequently delivered without difficulties. The infant was dried, stimulated, and handed off to the awaiting neonatal team after 1 minute of delayed cord clamping.The placenta was delivered spontaneously intact by gentle traction. The uterine cavity was cleared of any clot and debris. The hysterotomy was closed using 0-Monocryl in Madeline running locking fashion. Adequate hemostasis of the hysterotomy was noted. The bilateral gutters were cleared of all clot and debris. Bilateral tubes and ovaries were inspected and found to be within normal limits. The underlying fascia planes were inspected and found to be hemostatic. The fascia was closed with 0-Vicryl Madeline normal running fashion. The subcutaneous layer was irrigated with sterile saline and hemostasis obtained with the Bovie. The subcutaneous layer was closed with 2-0 plain. The skin layer was closed with 4-0 Vicryl. The patient tolerated the procedure well and was taken to the recovery room in stable condition. All instrument, needle, and  sponge  counts were correct.   Madeline Meyer Madeline Meyer 04/29/21 10:00 AM

## 2021-04-29 NOTE — Transfer of Care (Signed)
Immediate Anesthesia Transfer of Care Note  Patient: Madeline Meyer  Procedure(s) Performed: Repeat CESAREAN SECTION  Patient Location: PACU  Anesthesia Type:Spinal  Level of Consciousness: awake, alert  and oriented  Airway & Oxygen Therapy: Patient Spontanous Breathing  Post-op Assessment: Report given to RN and Post -op Vital signs reviewed and stable  Post vital signs: Reviewed and stable  Last Vitals:  Vitals Value Taken Time  BP 124/62 04/29/21 0935  Temp    Pulse 71 04/29/21 0939  Resp 21 04/29/21 0939  SpO2 99 % 04/29/21 0939  Vitals shown include unvalidated device data.  Last Pain:  Vitals:   04/29/21 0600  TempSrc: Oral         Complications: No notable events documented.

## 2021-04-29 NOTE — H&P (Signed)
Madeline Meyer is a 30 y.o. female G2P1001 [redacted]w[redacted]d presenting for scheduled repeat cesarean.   Patient has received routine prenatal care at [facility]. This is an IUI pregnancy dated by conception date c/w LMP, donor sperm in setting of same sex relationship. Patient had routine prenatal labs that were WNL. Panorama done that showed low risk predicted female. AFP neg screen. Anatomy US limited views in office due to body habitus and was referred to MFM where anatomy was cleared. Patient history significant for hyperthyroidism as managed by Endocrine, on PTU at conception through 14 weeks and has been on MTU most recently 10mg /d dosing and TFT well controlled. Patient also with GDMA1 well controlled on diet alone. Most recent growth performed at 36 weeks showed vertex presentation with EFW 6#2 (34%) normal fluid. Weekly antenatal testing reassuring. History of primary c/s for arrest of dilation after IOL at 42 weeks, patient desires repeat cesarean section.  Patient did test positive for COVID on routine preop screening but has been asymptomatic. Husband has also been asymptomatic and present at bedside as support person.  OB History     Gravida  2   Para  1   Term  1   Preterm      AB      Living  1      SAB      IAB      Ectopic      Multiple      Live Births  1          Past Medical History:  Diagnosis Date   Alcohol abuse    Gestational diabetes    Graves disease    Hyperthyroidism    Lyme disease    Uterine polyp    Past Surgical History:  Procedure Laterality Date   CESAREAN SECTION     DILATION AND CURETTAGE OF UTERUS     Family History: family history includes Graves' disease in her maternal grandmother; Heart failure in her father. Social History:  reports that she has never smoked. She has never used smokeless tobacco. She reports that she does not currently use alcohol. She reports that she does not use drugs.     Maternal Diabetes: Yes:  Diabetes  Type:  Diet controlled Genetic Screening: Normal Maternal Ultrasounds/Referrals: Normal Fetal Ultrasounds or other Referrals:  Other:  Maternal Substance Abuse:  No Significant Maternal Medications:  Meds include: Other:  Significant Maternal Lab Results:  Group B Strep negative Other Comments:  None  Review of Systems  All other systems reviewed and are negative. Per HPI Exam Physical Exam Vitals reviewed.  Constitutional:      Appearance: Normal appearance.  HENT:     Head: Normocephalic.  Cardiovascular:     Rate and Rhythm: Normal rate.  Pulmonary:     Effort: Pulmonary effort is normal.  Abdominal:     Palpations: Abdomen is soft.  Musculoskeletal:     Cervical back: Normal range of motion.  Skin:    General: Skin is warm and dry.  Neurological:     General: No focal deficit present.     Mental Status: She is alert and oriented to person, place, and time.  Psychiatric:        Mood and Affect: Mood normal.        Behavior: Behavior normal.      Blood pressure 114/70, pulse 69, temperature 98 F (36.7 C), temperature source Oral, resp. rate 18, height 5' 5.5" (1.664 m), weight 133.1 kg,  last menstrual period 07/28/2020, SpO2 100 %.  Prenatal labs: ABO, Rh:  --/--/O POS (10/26 0600) Antibody: NEG (10/26 0600) Rubella: Immune (04/04 0000) RPR: Nonreactive (04/04 0000)  HBsAg: Negative (04/04 0000)  HIV: Non-reactive (04/04 0000)  GBS: Negative/-- (09/28 0000)   ChemistryNo results for input(s): NA, K, CL, CO2, GLUCOSE, BUN, CREATININE, CALCIUM, GFRNONAA, GFRAA, ANIONGAP in the last 168 hours.  No results for input(s): PROT, ALBUMIN, AST, ALT, ALKPHOS, BILITOT in the last 168 hours. Hematology Recent Labs  Lab 04/29/21 0600  WBC 14.0*  RBC 4.57  HGB 11.6*  HCT 36.8  MCV 80.5  MCH 25.4*  MCHC 31.5  RDW 14.2  PLT 240   Cardiac EnzymesNo results for input(s): TROPONINI in the last 168 hours. No results for input(s): TROPIPOC in the last 168 hours.  BNPNo  results for input(s): BNP, PROBNP in the last 168 hours.  DDimer No results for input(s): DDIMER in the last 168 hours.   Assessment/Plan: Madeline Meyer is a 30 y.o. female G2P1001 [redacted]w[redacted]d admitted for scheduled repeat cesarean section.  Patient consented for cesarean section including risks of bleeding, infection, damage to surrounding organs, increased risk for future pregnancies including need for repeat cesarean section. Agreeable to blood transfusion if indicated. Consents signed and placed in patient chart.  -Admit to OR -Routine preop labs reviewed -NPO -Ancef 3 g antibiotic ppx -SCD VTE ppx -Hyperthyroidism cont home regimen -GDMA1 well controlled -GBS NEG, Rh POS, Rubella Imm -Routine preop/postop care   Madeline Meyer A Celene Pippins 04/29/2021, 7:29 AM

## 2021-04-29 NOTE — Anesthesia Postprocedure Evaluation (Signed)
Anesthesia Post Note  Patient: Madeline Meyer  Procedure(s) Performed: Repeat CESAREAN SECTION     Patient location during evaluation: PACU Anesthesia Type: Spinal Level of consciousness: oriented and awake and alert Pain management: pain level controlled Vital Signs Assessment: post-procedure vital signs reviewed and stable Respiratory status: spontaneous breathing, respiratory function stable and nonlabored ventilation Cardiovascular status: blood pressure returned to baseline and stable Postop Assessment: no headache, no backache, no apparent nausea or vomiting, spinal receding and patient able to bend at knees Anesthetic complications: no   No notable events documented.  Last Vitals:  Vitals:   04/29/21 1015 04/29/21 1030  BP: 119/64 122/64  Pulse: 64 83  Resp: 15 14  Temp:    SpO2: 97% 99%    Last Pain:  Vitals:   04/29/21 1009  TempSrc:   PainSc: 4    Pain Goal:                Epidural/Spinal Function Cutaneous sensation: Pins and Needles (04/29/21 1030), Patient able to flex knees: Yes (04/29/21 1030), Patient able to lift hips off bed: Yes (04/29/21 1030), Back pain beyond tenderness at insertion site: No (04/29/21 1030), Progressively worsening motor and/or sensory loss: No (04/29/21 1030), Bowel and/or bladder incontinence post epidural: No (04/29/21 1030)  Khamani Daniely A.

## 2021-04-30 DIAGNOSIS — O9902 Anemia complicating childbirth: Secondary | ICD-10-CM | POA: Diagnosis present

## 2021-04-30 LAB — CBC
HCT: 29.4 % — ABNORMAL LOW (ref 36.0–46.0)
Hemoglobin: 9.2 g/dL — ABNORMAL LOW (ref 12.0–15.0)
MCH: 25.1 pg — ABNORMAL LOW (ref 26.0–34.0)
MCHC: 31.3 g/dL (ref 30.0–36.0)
MCV: 80.3 fL (ref 80.0–100.0)
Platelets: 184 10*3/uL (ref 150–400)
RBC: 3.66 MIL/uL — ABNORMAL LOW (ref 3.87–5.11)
RDW: 14.2 % (ref 11.5–15.5)
WBC: 9.8 10*3/uL (ref 4.0–10.5)
nRBC: 0 % (ref 0.0–0.2)

## 2021-04-30 MED ORDER — POLYSACCHARIDE IRON COMPLEX 150 MG PO CAPS
150.0000 mg | ORAL_CAPSULE | Freq: Every day | ORAL | Status: DC
  Administered 2021-04-30 – 2021-05-01 (×2): 150 mg via ORAL
  Filled 2021-04-30 (×2): qty 1

## 2021-04-30 MED ORDER — MAGNESIUM OXIDE -MG SUPPLEMENT 400 (240 MG) MG PO TABS
400.0000 mg | ORAL_TABLET | Freq: Every day | ORAL | Status: DC
  Administered 2021-04-30 – 2021-05-01 (×2): 400 mg via ORAL
  Filled 2021-04-30 (×2): qty 1

## 2021-04-30 NOTE — Progress Notes (Signed)
   Subjective: POD# 1 Live born female  Birth Weight: 7 lb 7.2 oz (3380 g) APGAR: 8, 9  Newborn Delivery   Birth date/time: 04/29/2021 08:42:00 Delivery type: C-Section, Low Transverse Trial of labor: No C-section categorization: Repeat     Baby name: [place] Delivering provider: LAW, CASSANDRA A   circumcision declined Feeding: breast  Pain control at delivery: Spinal   Reports feeling well.  Patient reports tolerating PO.   Breast symptoms:latch suboptimal Pain controlled with  PO meds Denies HA/SOB/C/P/N/V/dizziness. Flatus present. She reports vaginal bleeding as normal, without clots.  She is ambulating, urinating without difficulty.     Objective:   VS:    Vitals:   04/29/21 1600 04/29/21 2035 04/29/21 2356 04/30/21 0513  BP: 114/64 124/66 116/69 (!) 106/49  Pulse: 69 70 67 63  Resp:  18 18 18   Temp: 98.3 F (36.8 C) 98.4 F (36.9 C) 98.1 F (36.7 C) 97.9 F (36.6 C)  TempSrc: Oral Oral Oral Oral  SpO2: 99% 99%    Weight:      Height:         Intake/Output Summary (Last 24 hours) at 04/30/2021 05/02/2021 Last data filed at 04/30/2021 0100 Gross per 24 hour  Intake 700 ml  Output 3085 ml  Net -2385 ml        Recent Labs    04/29/21 0600 04/30/21 0444  WBC 14.0* 9.8  HGB 11.6* 9.2*  HCT 36.8 29.4*  PLT 240 184     Blood type: --/--/O POS (10/26 0600)  Rubella: Immune (04/04 0000)  Vaccines: TDaP          UTD         Flu             UTD                    COVID-19 UTD   Physical Exam:  General: alert, cooperative, and no distress CV: Regular rate and rhythm Resp: clear Abdomen: soft, nontender, normal bowel sounds Incision: serous drainage present and pressure dressing in place Uterine Fundus: firm, below umbilicus, nontender Lochia: minimal Ext: edema +1 pedal  Assessment/Plan: 30 y.o.   POD# 1. 34                  Principal Problem:   Postpartum care following cesarean delivery 10/26 Active Problems:   Hyperthyroidism  -  stable on MTU 10 mg daily   Gestational diabetes mellitus (GDM), antepartum  - f/u screening postpartum outpatient   Previous cesarean section   Status post repeat low transverse cesarean section 10/26   Doing well, stable.    Remove pressure dressing and change soiled honeycomb after shower this am.            Advance diet as tolerated Encourage rest when baby rests Breastfeeding support Encourage to ambulate Routine post-op care  11/26, CNM, MSN 04/30/2021, 8:28 AM

## 2021-05-01 ENCOUNTER — Inpatient Hospital Stay (HOSPITAL_COMMUNITY): Admit: 2021-05-01 | Payer: Self-pay

## 2021-05-01 MED ORDER — MAGNESIUM OXIDE -MG SUPPLEMENT 400 (240 MG) MG PO TABS: 400.0000 mg | ORAL_TABLET | Freq: Every day | ORAL | Status: DC

## 2021-05-01 MED ORDER — SIMETHICONE 80 MG PO CHEW: 80.0000 mg | CHEWABLE_TABLET | ORAL | 0 refills | Status: DC | PRN

## 2021-05-01 MED ORDER — COCONUT OIL OIL: 1.0000 "application " | TOPICAL_OIL | 0 refills | Status: DC | PRN

## 2021-05-01 MED ORDER — SENNOSIDES-DOCUSATE SODIUM 8.6-50 MG PO TABS
2.0000 | ORAL_TABLET | Freq: Every day | ORAL | Status: DC
Start: 2021-05-01 — End: 2021-12-11

## 2021-05-01 MED ORDER — ACETAMINOPHEN 500 MG PO TABS: 1000.0000 mg | ORAL_TABLET | Freq: Four times a day (QID) | ORAL | 0 refills | Status: DC

## 2021-05-01 MED ORDER — OXYCODONE HCL 5 MG PO TABS: 5.0000 mg | ORAL_TABLET | Freq: Four times a day (QID) | ORAL | 0 refills | Status: AC | PRN

## 2021-05-01 MED ORDER — IBUPROFEN 600 MG PO TABS: 600.0000 mg | ORAL_TABLET | Freq: Four times a day (QID) | ORAL | 0 refills | Status: DC

## 2021-05-01 MED ORDER — METHIMAZOLE 10 MG PO TABS: 10.0000 mg | ORAL_TABLET | Freq: Every morning | ORAL | 1 refills | Status: DC

## 2021-05-01 MED ORDER — POLYSACCHARIDE IRON COMPLEX 150 MG PO CAPS: 150.0000 mg | ORAL_CAPSULE | Freq: Every day | ORAL | Status: DC

## 2021-05-01 NOTE — Lactation Note (Addendum)
This note was copied from a baby's chart. Lactation Consultation Note  Patient Name: Madeline Meyer RSWNI'O Date: 05/01/2021 Reason for consult: Follow-up assessment (Per mom, she is having pain with latch ( pinching at the breast). LC observed infant has upper frenulum that appears tight and lingual frenulum which is stiff and infant not extending tongue past gum line which maybe limiting mobility of tongue.) Age:32 hours Mom latched infant on her right breast using the cross cradle hold position, infant was re-latch few times to aid in mom's comfort due to feeling pinching with infant's  latch. LC assisted with flanging infant's top lip out and lower infant's bottom jaw.  Infant appears to have upper frenulum that is tight and lower lingual frenulum, and infant doesn't extend his tongue past the gumline. Mom is working on positioning infant for best comfort [name]) dad would like for infant to have frenectomy and will discuss this with Pediatrician.  Mom will continue to breastfeed infant according to feeding cues, 8 to 12+ or more times within 24 hours, skin to skin. Mom knows infant is currently cluster feeding. Mom will latch infant on both breast during a feeding and will hand express after latching infant at the breast, giving extra volume of EBM by spoon.   Maternal Data    Feeding Mother's Current Feeding Choice: Breast Milk  LATCH Score Latch: Grasps breast easily, tongue down, lips flanged, rhythmical sucking.  Audible Swallowing: A few with stimulation  Type of Nipple: Everted at rest and after stimulation  Comfort (Breast/Nipple): Soft / non-tender  Hold (Positioning): Assistance needed to correctly position infant at breast and maintain latch.  LATCH Score: 8   Lactation Tools Discussed/Used    Interventions Interventions: Skin to skin;Hand express;Breast compression;Adjust position;Support pillows;Position options;Expressed milk  Discharge    Consult  Status Consult Status: Follow-up Date: 05/01/21 Follow-up type: In-patient    [name] 05/01/2021, 2:09 AM

## 2021-05-01 NOTE — Discharge Summary (Signed)
OB Discharge Summary  Patient Name: Madeline Meyer DOB: 1991/05/01 MRN: [id number]  Date of admission: 04/29/2021 Delivering provider: Clance Boll A   Admitting diagnosis: Previous cesarean section [Z98.891] Intrauterine pregnancy: [redacted]w[redacted]d     Secondary diagnosis: Patient Active Problem List   Diagnosis Date Noted   Maternal anemia, with delivery - IDA w/ ABL 04/30/2021   Previous cesarean section 04/29/2021   Status post repeat low transverse cesarean section 10/26 04/29/2021   Postpartum care following cesarean delivery 10/26 04/29/2021   Gestational diabetes mellitus (GDM), antepartum 02/16/2021   Hyperthyroidism 04/21/2020   Additional problems:none   Date of discharge: 05/01/2021   Discharge diagnosis: Principal Problem:   Postpartum care following cesarean delivery 10/26 Active Problems:   Hyperthyroidism   Gestational diabetes mellitus (GDM), antepartum   Previous cesarean section   Status post repeat low transverse cesarean section 10/26   Maternal anemia, with delivery - IDA w/ ABL                                                              Post partum procedures: none  Augmentation: N/A Pain control: Spinal  Complications: None  Hospital course:  Sceduled C/S   30 y.o. yo G2P2002 at [redacted]w[redacted]d was admitted to the hospital 04/29/2021 for scheduled cesarean section with the following indication:Elective Repeat.Delivery details are as follows:  Membrane Rupture Time/Date: 8:41 AM ,04/29/2021   Delivery Method:C-Section, Low Transverse  Details of operation can be found in separate operative note.  Patient had an uncomplicated postpartum course.  She is ambulating, tolerating a regular diet, passing flatus, and urinating well. Patient is discharged home in stable condition on  05/01/21        Newborn Data: Birth date:04/29/2021  Birth time:8:42 AM  Gender:Female  Living status:Living  Apgars:8 ,9  Weight:3380 g     Physical exam  Vitals:   04/29/21 2356  04/30/21 0513 04/30/21 1316 05/01/21 0515  BP: 116/69 (!) 106/49 114/67 128/62  Pulse: 67 63 61 64  Resp: 18 18 16 17   Temp: 98.1 F (36.7 C) 97.9 F (36.6 C) 98.6 F (37 C) 98.1 F (36.7 C)  TempSrc: Oral Oral Axillary Axillary  SpO2:   98%   Weight:      Height:       General: alert, cooperative, and no distress Lochia: appropriate Uterine Fundus: firm Incision: Healing well with no significant drainage, Dressing is clean, dry, and intact DVT Evaluation: No cords or calf tenderness. No significant calf/ankle edema. Labs: Lab Results  Component Value Date   WBC 9.8 04/30/2021   HGB 9.2 (L) 04/30/2021   HCT 29.4 (L) 04/30/2021   MCV 80.3 04/30/2021   PLT 184 04/30/2021   CMP Latest Ref Rng & Units 02/24/2021  Glucose 70 - 99 mg/dL 80  BUN 6 - 23 mg/dL 6  Creatinine 02/26/2021 - 7.94 mg/dL 8.01  Sodium 6.55 - 374 mEq/L 136  Potassium 3.5 - 5.1 mEq/L 4.1  Chloride 96 - 112 mEq/L 103  CO2 19 - 32 mEq/L 26  Calcium 8.4 - 10.5 mg/dL 9.2  Total Protein 6.0 - 8.3 g/dL 6.5  Total Bilirubin 0.2 - 1.2 mg/dL 0.3  Alkaline Phos 39 - 117 U/L 86  AST 0 - 37 U/L 11  ALT 0 - 35 U/L 13  Edinburgh Postnatal Depression Scale Screening Tool 04/30/2021  I have been able to laugh and see the funny side of things. 0  I have looked forward with enjoyment to things. 0  I have blamed myself unnecessarily when things went wrong. 0  I have been anxious or worried for no good reason. 0  I have felt scared or panicky for no good reason. 0  Things have been getting on top of me. 0  I have been so unhappy that I have had difficulty sleeping. 0  I have felt sad or miserable. 0  I have been so unhappy that I have been crying. 0  The thought of harming myself has occurred to me. 0  Edinburgh Postnatal Depression Scale Total 0   Vaccines: TDaP          UTD        COVID-19   UTD  Discharge instruction:  per After Visit Summary,  [facility] OB booklet and  "Understanding Mother & Baby Care" hospital  booklet  After Visit Meds:  Allergies as of 05/01/2021       Reactions   Other Anaphylaxis   Tree nuts   Cimetidine Hives        Medication List     TAKE these medications    acetaminophen 500 MG tablet Commonly known as: TYLENOL Take 2 tablets (1,000 mg total) by mouth every 6 (six) hours.   Calcium Soft Chews 500-07-998-40 MG-UNT-MCG Chew Generic drug: Calcium-Iron-Vit D-Vit K Chew 1 tablet by mouth in the morning.   coconut oil Oil Apply 1 application topically as needed.   Fish Oil 1000 MG Caps Take 1,000 mg by mouth in the morning.   ibuprofen 600 MG tablet Commonly known as: ADVIL Take 1 tablet (600 mg total) by mouth every 6 (six) hours.   iron polysaccharides 150 MG capsule Commonly known as: Ferrex 150 Take 1 capsule (150 mg total) by mouth daily.   magnesium oxide 400 (240 Mg) MG tablet Commonly known as: MAG-OX Take 1 tablet (400 mg total) by mouth daily. For prevention of constipation.   methimazole 10 MG tablet Commonly known as: TAPAZOLE Take 1 tablet (10 mg total) by mouth in the morning. What changed:  medication strength how much to take when to take this   oxyCODONE 5 MG immediate release tablet Commonly known as: Oxy IR/ROXICODONE Take 1 tablet (5 mg total) by mouth every 6 (six) hours as needed for up to 5 days for moderate pain.   PRENATAL VITAMIN PO Take 1 tablet by mouth in the morning.   senna-docusate 8.6-50 MG tablet Commonly known as: Senokot-S Take 2 tablets by mouth daily.   simethicone 80 MG chewable tablet Commonly known as: MYLICON Chew 1 tablet (80 mg total) by mouth as needed for flatulence.        Diet: iron rich diet  Activity: Advance as tolerated. Pelvic rest for 6 weeks.   Postpartum contraception:  NA  Newborn Data: Live born female  Birth Weight: 7 lb 7.2 oz (3380 g) APGAR: 8, 9  Newborn Delivery   Birth date/time: 04/29/2021 08:42:00 Delivery type: C-Section, Low Transverse Trial of labor:  No C-section categorization: Repeat      named [place] Baby Feeding: Breast Disposition:home with mother Circumcision: declined   Delivery Report:  Review the Delivery Report for details.    Follow up:  Follow-up Information     Law, Cassandra A, DO. Schedule an appointment as soon as possible for a visit in 6  week(s).   Specialty: Obstetrics and Gynecology Why: For Postpartum follow-up Contact information: [address] [place] [address] [phone]                   Signed: Neta Mends, CNM, MSN 05/01/2021, 10:16 AM

## 2021-05-01 NOTE — Lactation Note (Signed)
This note was copied from a baby's chart. Lactation Consultation Note  Patient Name: Boy Sherrin Stahle [id number] Date: 05/01/2021 Reason for consult: Follow-up assessment;Term;Infant weight loss;Other (Comment) (increased swallows on the right breast) Age:32 hours As LC entered the room / baby wide awake and fussy / showing feeding cues.  Mom needed minimal assist to latch on the left breast / football / swallows noetd and fed 10 mins . Mom switched to the right breast / increased swallows noted and better depth.  LC reviewed and updated the doc flow sheets per mom.  LC reviewed BF D/C teaching ( see below ) and due to the 8 % weight loss , and the tongue mobility and skin notch under the upper lip ( upper lip stretches with latch and exam ) LC recommended when the baby is not cluster feeding and after baby is settled add post pumping both breast fr 10 - 15 mins for EBM to be available for weight loss.  Per mom will be calling the dentist on the resource for assessment , tried today and not open , so will try next week.  LC recommended a LC F/U with [place] / mom receptive for [facility] to place a request in Epic.  Maternal Data    Feeding Mother's Current Feeding Choice: Breast Milk  LATCH Score Latch: Grasps breast easily, tongue down, lips flanged, rhythmical sucking.  Audible Swallowing: Spontaneous and intermittent  Type of Nipple: Everted at rest and after stimulation  Comfort (Breast/Nipple): Filling, red/small blisters or bruises, mild/mod discomfort  Hold (Positioning): Assistance needed to correctly position infant at breast and maintain latch.  LATCH Score: 8   Lactation Tools Discussed/Used Tools: Pump Breast pump type: Manual Pump Education: Milk Storage  Interventions Interventions: Breast feeding basics reviewed;Skin to skin;Hand pump  Discharge Discharge Education: Engorgement and breast care;Warning signs for feeding baby Pump:  Personal;Manual;DEBP  Consult Status Consult Status: Complete Date: 05/01/21 Follow-up type: In-patient    Matilde Sprang Ayub Kirsh 05/01/2021, 11:26 AM

## 2021-05-01 NOTE — Discharge Instructions (Signed)
Lactation outpatient support - home visit ° ° °Jessica Bowers, IBCLC (lactation consultant)  & Birth Doula ° °Phone (text or call): [phone] °Email: [email or web address] °[email or web address] ° ° °Linda Coppola °RN, MHA, IBCLC °at [facility]: Lactation Consultant ° °[email or web address]://[email or web address]/ °Mail: [email or web address] °Tel: [phone] ° ° °Additional breastfeeding resources: ° °[employer] [facility] °https://ibconline.ca/information-sheets/ ° °[employer] [place] ° °www.lllofnc.org ° ° °Other Resources: ° °Chiropractic specialist  ° °Dr. Leanna Hastings °[email or web address] ° ° °Craniosacral therapy for baby ° °Erin Balkind  °[email or web address]://[email or web address]/ ° °

## 2021-05-09 ENCOUNTER — Telehealth (HOSPITAL_COMMUNITY): Payer: Self-pay

## 2021-05-09 NOTE — Telephone Encounter (Signed)
No answer. Left message to return nurse call.  Marcelino Duster [facility] 05/09/2021,1431

## 2021-06-02 ENCOUNTER — Encounter: Payer: Self-pay | Admitting: Internal Medicine

## 2021-06-02 ENCOUNTER — Ambulatory Visit (INDEPENDENT_AMBULATORY_CARE_PROVIDER_SITE_OTHER): Payer: Managed Care, Other (non HMO) | Admitting: Internal Medicine

## 2021-06-02 ENCOUNTER — Other Ambulatory Visit: Payer: Self-pay

## 2021-06-02 VITALS — BP 126/82 | HR 75 | Ht 65.5 in | Wt 287.0 lb

## 2021-06-02 DIAGNOSIS — E0590 Thyrotoxicosis, unspecified without thyrotoxic crisis or storm: Secondary | ICD-10-CM | POA: Diagnosis not present

## 2021-06-02 DIAGNOSIS — Z8632 Personal history of gestational diabetes: Secondary | ICD-10-CM | POA: Diagnosis not present

## 2021-06-02 DIAGNOSIS — O24419 Gestational diabetes mellitus in pregnancy, unspecified control: Secondary | ICD-10-CM

## 2021-06-02 DIAGNOSIS — E0500 Thyrotoxicosis with diffuse goiter without thyrotoxic crisis or storm: Secondary | ICD-10-CM | POA: Diagnosis not present

## 2021-06-02 LAB — T4, FREE: Free T4: 0.72 ng/dL (ref 0.60–1.60)

## 2021-06-02 LAB — TSH: TSH: 0.89 u[IU]/mL (ref 0.35–5.50)

## 2021-06-02 NOTE — Progress Notes (Signed)
Name: Madeline Meyer  MRN/ DOB: [id number], 09/16/1990    Age/ Sex: 30 y.o., female     PCP: Patient, No Pcp Per (Inactive)   Reason for Endocrinology Evaluation: Hyperthyroidism     Initial Endocrinology Clinic Visit: 04/21/2020    PATIENT IDENTIFIER: Madeline Meyer is a 30 y.o., female with a past medical history of hyperthyroidism. She has followed with [facility] since 04/21/2020 for consultative assistance with management of her Hyperthyroidism   HISTORICAL SUMMARY: The patient was first diagnosed with hyperthyroidism in 12/2018 during pregnancy. She was on PTU during first trimester , then switched to methimazole . Was non compliant with medication intake post delivery , was lost to follow up until established with Dr Everardo All 04/2020   She switched care from Dr. Everardo All to me by 09/2020.  She had already been switched from methimazole to PTU in 07/2020.  By the time she saw me for the first time she was already pregnant in her first trimester at the time   We will switch PTU to methimazole during her second trimester in April 2022  TRAb < 1 during 3rd trimester 02/2021  Maternal GM with Graves' disease  Paternal uncle with Graves' disease  SUBJECTIVE:    Today (06/02/2021):  Madeline Meyer is here for a follow up on hyperthyroidism   She is S/P C-section 04/29/2021 of a baby boy ( [place] ) . She is accompanied by her 32 month  old daughter Bex   She has lost weight since delivery  She had URI 3 weeks ago , treated with OTC meds ,, no recent cough or fever   Required Abx for abdominal wall incision infection   Denies nausea or vomiting  Denies palpitations  Denies local neck symptoms  No eye symptoms  She is pumping    Methimazole 10 mg, 1 daily   Was diagnosed with gestation diabetes, she is checking 4x a day , she took class about nutrition  BG range 70- 118 mg/dL      HISTORY:  Past Medical History:  Past Medical History:   Diagnosis Date   Alcohol abuse    Gestational diabetes    Graves disease    Hyperthyroidism    Lyme disease    Uterine polyp    Past Surgical History:  Past Surgical History:  Procedure Laterality Date   CESAREAN SECTION     CESAREAN SECTION N/A 04/29/2021   Procedure: Repeat CESAREAN SECTION;  Surgeon: Clance Boll A, DO;  [facility];  Service: Obstetrics;  Laterality: N/A;  EDD: 05/04/21   DILATION AND CURETTAGE OF UTERUS     Social History:  reports that she has never smoked. She has never used smokeless tobacco. She reports that she does not currently use alcohol. She reports that she does not use drugs. Family History:  Family History  Problem Relation Age of Onset   Heart failure Father    Graves' disease Maternal Grandmother      HOME MEDICATIONS: Allergies as of 06/02/2021       Reactions   Other Anaphylaxis   Tree nuts   Cimetidine Hives        Medication List        Accurate as of June 02, 2021 10:18 AM. If you have any questions, ask your nurse or doctor.          acetaminophen 500 MG tablet Commonly known as: TYLENOL Take 2 tablets (1,000 mg total) by mouth every 6 (six) hours.  Calcium Soft Chews 500-07-998-40 MG-UNT-MCG Chew Generic drug: Calcium-Iron-Vit D-Vit K Chew 1 tablet by mouth in the morning.   coconut oil Oil Apply 1 application topically as needed.   Fish Oil 1000 MG Caps Take 1,000 mg by mouth in the morning.   ibuprofen 600 MG tablet Commonly known as: ADVIL Take 1 tablet (600 mg total) by mouth every 6 (six) hours.   iron polysaccharides 150 MG capsule Commonly known as: Ferrex 150 Take 1 capsule (150 mg total) by mouth daily.   magnesium oxide 400 (240 Mg) MG tablet Commonly known as: MAG-OX Take 1 tablet (400 mg total) by mouth daily. For prevention of constipation.   methimazole 10 MG tablet Commonly known as: TAPAZOLE Take 1 tablet (10 mg total) by mouth in the morning.   PRENATAL VITAMIN  PO Take 1 tablet by mouth in the morning.   senna-docusate 8.6-50 MG tablet Commonly known as: Senokot-S Take 2 tablets by mouth daily.   simethicone 80 MG chewable tablet Commonly known as: MYLICON Chew 1 tablet (80 mg total) by mouth as needed for flatulence.          OBJECTIVE:   PHYSICAL EXAM: VS: BP 126/82 (BP Location: Left Arm, Patient Position: Sitting, Cuff Size: Small)   Pulse 75   Ht 5' 5.5" (1.664 m)   Wt 287 lb (130.2 kg)   SpO2 99%   BMI 47.03 kg/m    EXAM: General: Pt appears well and is in NAD  Eyes: External eye exam normal without stare, lid lag or exophthalmos.  EOM intact.    Neck: General: Supple without adenopathy. Thyroid: Thyroid size normal.  No goiter or nodules appreciated.   Lung: Rales noted on the right   Heart: Auscultation: RRR.  Abdomen: Gravid uterus   Extremities:  BL LE: No pretibial edema normal ROM and strength.  Mental Status: Judgment, insight: Intact Orientation: Oriented to time, place, and person Mood and affect: No depression, anxiety, or agitation     DATA REVIEWED:   Latest Reference Range & Units 06/02/21 10:22  TSH 0.35 - 5.50 uIU/mL 0.89  T4,Free(Direct) 0.60 - 1.60 ng/dL 1.66   Results for Madeline Meyer, Madeline "NATTY" (MRN [id number]) as of 02/26/2021 07:47  Ref. Range 02/24/2021 09:22  Sodium Latest Ref Range: 135 - 145 mEq/L 136  Potassium Latest Ref Range: 3.5 - 5.1 mEq/L 4.1  Chloride Latest Ref Range: 96 - 112 mEq/L 103  CO2 Latest Ref Range: 19 - 32 mEq/L 26  Glucose Latest Ref Range: 70 - 99 mg/dL 80  BUN Latest Ref Range: 6 - 23 mg/dL 6  Creatinine Latest Ref Range: 0.40 - 1.20 mg/dL 9.32  Calcium Latest Ref Range: 8.4 - 10.5 mg/dL 9.2  Alkaline Phosphatase Latest Ref Range: 39 - 117 U/L 86  Albumin Latest Ref Range: 3.5 - 5.2 g/dL 3.5  AST Latest Ref Range: 0 - 37 U/L 11  ALT Latest Ref Range: 0 - 35 U/L 13  Total Protein Latest Ref Range: 6.0 - 8.3 g/dL 6.5  Total Bilirubin Latest Ref Range: 0.2 -  1.2 mg/dL 0.3  GFR Latest Ref Range: >60.00 mL/min 122.46  WBC Latest Ref Range: 4.0 - 10.5 K/uL 9.9  RBC Latest Ref Range: 3.87 - 5.11 Mil/uL 4.49  Hemoglobin Latest Ref Range: 12.0 - 15.0 g/dL 35.5  HCT Latest Ref Range: 36.0 - 46.0 % 36.6  MCV Latest Ref Range: 78.0 - 100.0 fl 81.4  MCHC Latest Ref Range: 30.0 - 36.0 g/dL 73.2  RDW Latest  Ref Range: 11.5 - 15.5 % 13.8  Platelets Latest Ref Range: 150.0 - 400.0 K/uL 219.0  Neutrophils Latest Ref Range: 43.0 - 77.0 % 76.0  Lymphocytes Latest Ref Range: 12.0 - 46.0 % 16.0  Monocytes Relative Latest Ref Range: 3.0 - 12.0 % 7.0  Eosinophil Latest Ref Range: 0.0 - 5.0 % 0.8  Basophil Latest Ref Range: 0.0 - 3.0 % 0.2  NEUT# Latest Ref Range: 1.4 - 7.7 K/uL 7.5  Lymphocyte # Latest Ref Range: 0.7 - 4.0 K/uL 1.6  Monocyte # Latest Ref Range: 0.1 - 1.0 K/uL 0.7  Eosinophils Absolute Latest Ref Range: 0.0 - 0.7 K/uL 0.1  Basophils Absolute Latest Ref Range: 0.0 - 0.1 K/uL 0.0     ASSESSMENT / PLAN / RECOMMENDATIONS:    Hyperthyroidism secondary to Graves' Disease:   - Has been diagnosed with Graves' disease since 2020 with her first pregnancy.  - She is clinically euthyroid  - TFT's normal , no change      Medications   Continue  methimazole 10 mg, daily    2. Graves' Disease:   - No extra thyroidal manifestations of thyroid disease     3. Gestational Diabetes :   - This was well controlled on diet. Was managed by Gyn - Unfortunately her A1c was not drawn today, will recheck on next labs in 8 weeks  - We discussed today that she is at very high risk for developing T2DM if she does not follow healthy life style changes with exercise and low carb diet     F/U in 6 months  Labs in 8 weeks    Addendum: Discussed lab results with pt on 11/25/2020  Signed electronically by: Lyndle Herrlich, MD  [facility]  [facility] [address] [place] [place], [place] [address] Phone:  [phone] FAX: [phone]      CC: Patient, No Pcp Per (Inactive) No address on file Phone: None  Fax: None   Return to Endocrinology clinic as below: No future appointments.

## 2021-06-03 DIAGNOSIS — Z8632 Personal history of gestational diabetes: Secondary | ICD-10-CM | POA: Insufficient documentation

## 2021-06-03 MED ORDER — METHIMAZOLE 10 MG PO TABS: 10.0000 mg | ORAL_TABLET | Freq: Every morning | ORAL | 1 refills | Status: DC

## 2021-07-28 ENCOUNTER — Other Ambulatory Visit (INDEPENDENT_AMBULATORY_CARE_PROVIDER_SITE_OTHER): Payer: Managed Care, Other (non HMO)

## 2021-07-28 DIAGNOSIS — E0590 Thyrotoxicosis, unspecified without thyrotoxic crisis or storm: Secondary | ICD-10-CM

## 2021-07-28 DIAGNOSIS — Z8632 Personal history of gestational diabetes: Secondary | ICD-10-CM

## 2021-07-28 LAB — T4, FREE: Free T4: 1.03 ng/dL (ref 0.60–1.60)

## 2021-07-28 LAB — TSH: TSH: 0.2 u[IU]/mL — ABNORMAL LOW (ref 0.35–5.50)

## 2021-07-28 LAB — HEMOGLOBIN A1C: Hgb A1c MFr Bld: 5.5 % (ref 4.6–6.5)

## 2021-07-29 ENCOUNTER — Other Ambulatory Visit: Payer: Self-pay | Admitting: Internal Medicine

## 2021-07-29 MED ORDER — METHIMAZOLE 10 MG PO TABS
15.0000 mg | ORAL_TABLET | Freq: Every morning | ORAL | 1 refills | Status: DC
Start: 2021-07-29 — End: 2021-12-11

## 2021-12-01 ENCOUNTER — Ambulatory Visit: Payer: Managed Care, Other (non HMO) | Admitting: Internal Medicine

## 2021-12-10 NOTE — Progress Notes (Signed)
Name: Madeline Meyer  MRN/ DOB: [id number], [birth year]    Age/ Sex: 31 y.o., female     PCP: Patient, No Pcp Per (Inactive)   Reason for Endocrinology Evaluation: Hyperthyroidism     Initial Endocrinology Clinic Visit: 04/21/2020    PATIENT IDENTIFIER: Madeline Meyer is a 31 y.o., female with a past medical history of hyperthyroidism. She has followed with [facility] since 04/21/2020 for consultative assistance with management of her Hyperthyroidism   HISTORICAL SUMMARY: The patient was first diagnosed with hyperthyroidism in 12/2018 during pregnancy. She was on PTU during first trimester , then switched to methimazole . Was non compliant with medication intake post delivery , was lost to follow up until established with Dr Loanne Drilling 04/2020   She switched care from Dr. Loanne Drilling to me by 09/2020.  She had already been switched from methimazole to PTU in 07/2020.  By the time she saw me for the first time she was already pregnant in her first trimester at the time   We will switch PTU to methimazole during her second trimester in April 2022  TRAb < 1 during 3rd trimester 02/2021  She is S/P C-section 04/29/2021 of a baby boy ( [place] )      Maternal GM with Graves' disease  Paternal uncle with Graves' disease  SUBJECTIVE:    Today (12/11/2021):  Madeline Meyer is here for a follow up on hyperthyroidism  Pt has been noted with weight gain  She is not breast feeding  Denies constipation, has occasional diarrhea  Denies palpitations  Denies local neck symptoms  Has had burning of the eyes yesterday as well as itching of the ears     Methimazole 10 mg, 1.5 daily      HISTORY:  Past Medical History:  Past Medical History:  Diagnosis Date   Alcohol abuse    Gestational diabetes    Graves disease    Hyperthyroidism    Lyme disease    Uterine polyp    Past Surgical History:  Past Surgical History:  Procedure Laterality Date   CESAREAN SECTION      CESAREAN SECTION N/A 04/29/2021   Procedure: Repeat CESAREAN SECTION;  Surgeon: Langley Gauss A, DO;  Location: [facility];  Service: Obstetrics;  Laterality: N/A;  EDD: 05/04/21   DILATION AND CURETTAGE OF UTERUS     Social History:  reports that she has never smoked. She has never used smokeless tobacco. She reports that she does not currently use alcohol. She reports that she does not use drugs. Family History:  Family History  Problem Relation Age of Onset   Heart failure Father    Graves' disease Maternal Grandmother      HOME MEDICATIONS: Allergies as of 12/11/2021       Reactions   Other Anaphylaxis   Tree nuts   Cimetidine Hives        Medication List        Accurate as of December 11, 2021 12:45 PM. If you have any questions, ask your nurse or doctor.          STOP taking these medications    Fish Oil 1000 MG Caps Stopped by: Dorita Sciara, MD   PRENATAL VITAMIN PO Stopped by: Dorita Sciara, MD   senna-docusate 8.6-50 MG tablet Commonly known as: Senokot-S Stopped by: Dorita Sciara, MD   simethicone 80 MG chewable tablet Commonly known as: MYLICON Stopped by: Dorita Sciara, MD  TAKE these medications    methimazole 10 MG tablet Commonly known as: TAPAZOLE Take 1.5 tablets (15 mg total) by mouth in the morning.          OBJECTIVE:   PHYSICAL EXAM: VS: BP 108/72 (BP Location: Left Arm, Patient Position: Sitting, Cuff Size: Normal)   Pulse 77   Ht 5' 5.5" (1.664 m)   Wt (!) 301 lb 12.8 oz (136.9 kg)   SpO2 97%   BMI 49.46 kg/m    EXAM: General: Pt appears well and is in NAD  Eyes: External eye exam normal without stare, lid lag or exophthalmos.  EOM intact.    Neck: General: Supple without adenopathy. Thyroid: Thyroid size normal.  No goiter or nodules appreciated.   Lung: Rales noted on the right   Heart: Auscultation: RRR.  Abdomen: Soft non-tender   Extremities:  BL LE: No pretibial edema  normal ROM and strength.  Mental Status: Judgment, insight: Intact Orientation: Oriented to time, place, and person Mood and affect: No depression, anxiety, or agitation     DATA REVIEWED:  Latest Reference Range & Units 12/11/21 12:49  TSH 0.35 - 5.50 uIU/mL 2.08  T4,Free(Direct) 0.60 - 1.60 ng/dL 0.79    Results for Madeline Meyer, Madeline "NATTY" (MRN [id number]) as of 02/26/2021 07:47  Ref. Range 02/24/2021 09:22  Sodium Latest Ref Range: 135 - 145 mEq/L 136  Potassium Latest Ref Range: 3.5 - 5.1 mEq/L 4.1  Chloride Latest Ref Range: 96 - 112 mEq/L 103  CO2 Latest Ref Range: 19 - 32 mEq/L 26  Glucose Latest Ref Range: 70 - 99 mg/dL 80  BUN Latest Ref Range: 6 - 23 mg/dL 6  Creatinine Latest Ref Range: 0.40 - 1.20 mg/dL 0.53  Calcium Latest Ref Range: 8.4 - 10.5 mg/dL 9.2  Alkaline Phosphatase Latest Ref Range: 39 - 117 U/L 86  Albumin Latest Ref Range: 3.5 - 5.2 g/dL 3.5  AST Latest Ref Range: 0 - 37 U/L 11  ALT Latest Ref Range: 0 - 35 U/L 13  Total Protein Latest Ref Range: 6.0 - 8.3 g/dL 6.5  Total Bilirubin Latest Ref Range: 0.2 - 1.2 mg/dL 0.3  GFR Latest Ref Range: >60.00 mL/min 122.46  WBC Latest Ref Range: 4.0 - 10.5 K/uL 9.9  RBC Latest Ref Range: 3.87 - 5.11 Mil/uL 4.49  Hemoglobin Latest Ref Range: 12.0 - 15.0 g/dL 12.0  HCT Latest Ref Range: 36.0 - 46.0 % 36.6  MCV Latest Ref Range: 78.0 - 100.0 fl 81.4  MCHC Latest Ref Range: 30.0 - 36.0 g/dL 32.8  RDW Latest Ref Range: 11.5 - 15.5 % 13.8  Platelets Latest Ref Range: 150.0 - 400.0 K/uL 219.0  Neutrophils Latest Ref Range: 43.0 - 77.0 % 76.0  Lymphocytes Latest Ref Range: 12.0 - 46.0 % 16.0  Monocytes Relative Latest Ref Range: 3.0 - 12.0 % 7.0  Eosinophil Latest Ref Range: 0.0 - 5.0 % 0.8  Basophil Latest Ref Range: 0.0 - 3.0 % 0.2  NEUT# Latest Ref Range: 1.4 - 7.7 K/uL 7.5  Lymphocyte # Latest Ref Range: 0.7 - 4.0 K/uL 1.6  Monocyte # Latest Ref Range: 0.1 - 1.0 K/uL 0.7  Eosinophils Absolute Latest Ref Range:  0.0 - 0.7 K/uL 0.1  Basophils Absolute Latest Ref Range: 0.0 - 0.1 K/uL 0.0     ASSESSMENT / PLAN / RECOMMENDATIONS:    Hyperthyroidism secondary to Graves' Disease:   - Has been diagnosed with Graves' disease since 2020 with her first pregnancy.  - She is clinically  euthyroid  - TFT's normal , no change      Medications   Continue  methimazole 10 mg, 1.5 tabs daily    2. Graves' Disease:   - No extra thyroidal manifestations of thyroid disease       F/U in 6 months   Signed electronically by: Mack Guise, MD  [facility]  [facility] [address] [place], [place] [address] Phone: [phone] FAX: [phone]      CC: Patient, No Pcp Per (Inactive) No address on file Phone: None  Fax: None   Return to Endocrinology clinic as below: No future appointments.

## 2021-12-11 ENCOUNTER — Ambulatory Visit (INDEPENDENT_AMBULATORY_CARE_PROVIDER_SITE_OTHER): Payer: Managed Care, Other (non HMO) | Admitting: Internal Medicine

## 2021-12-11 ENCOUNTER — Encounter: Payer: Self-pay | Admitting: Internal Medicine

## 2021-12-11 VITALS — BP 108/72 | HR 77 | Ht 65.5 in | Wt 301.8 lb

## 2021-12-11 DIAGNOSIS — E0590 Thyrotoxicosis, unspecified without thyrotoxic crisis or storm: Secondary | ICD-10-CM | POA: Diagnosis not present

## 2021-12-11 DIAGNOSIS — E0500 Thyrotoxicosis with diffuse goiter without thyrotoxic crisis or storm: Secondary | ICD-10-CM | POA: Insufficient documentation

## 2021-12-11 LAB — T4, FREE: Free T4: 0.79 ng/dL (ref 0.60–1.60)

## 2021-12-11 LAB — TSH: TSH: 2.08 u[IU]/mL (ref 0.35–5.50)

## 2021-12-11 MED ORDER — METHIMAZOLE 10 MG PO TABS
15.0000 mg | ORAL_TABLET | Freq: Every morning | ORAL | 3 refills | Status: DC
Start: 2021-12-11 — End: 2022-08-13

## 2021-12-12 LAB — T3: T3, Total: 122 ng/dL (ref 76–181)

## 2022-04-07 ENCOUNTER — Telehealth: Payer: Self-pay | Admitting: Genetic Counselor

## 2022-04-07 NOTE — Telephone Encounter (Signed)
Scheduled appt per 10/2 referral. Pt is aware of appt date and time. Pt is aware to arrive 15 mins prior to appt time and to bring and updated insurance card. Pt is aware of appt location.   

## 2022-04-25 IMAGING — US US MFM OB DETAIL+14 WK
1 series · 14 of 28 positions shown · non-contrast
Comparison: none

[Series 1: us mfm ob detail+14 wk · 61 acquisitions, 14 frames shown]
[im 3/61]
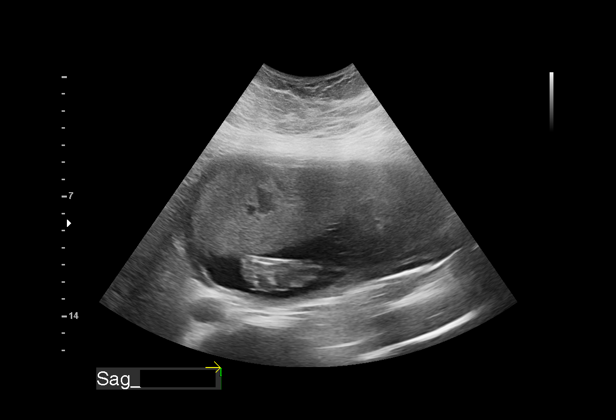
[im 7/61]
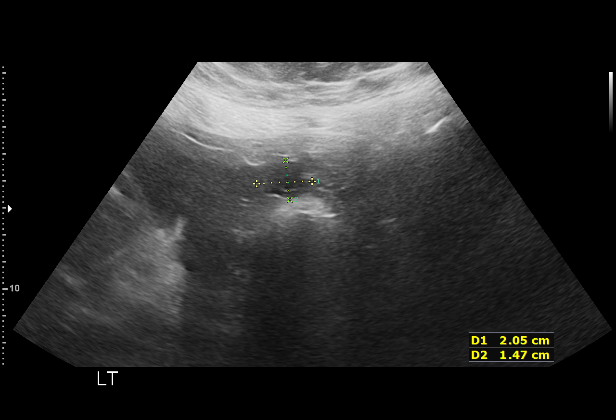
[im 12/61]
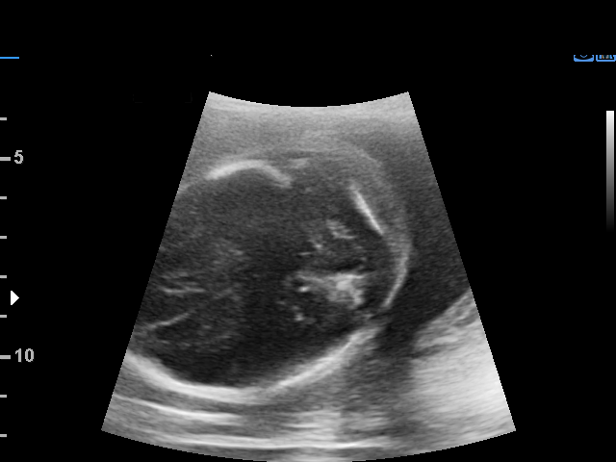
[im 16/61]
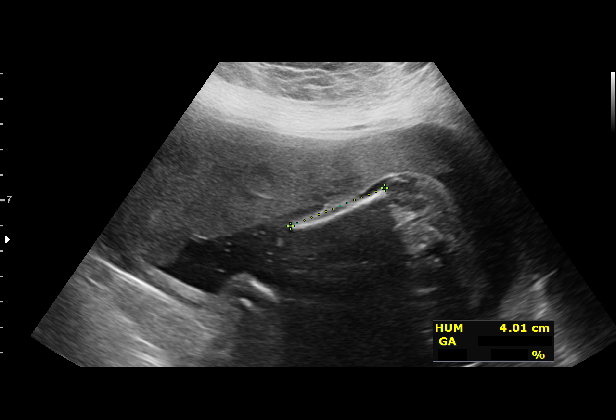
[im 21/61]
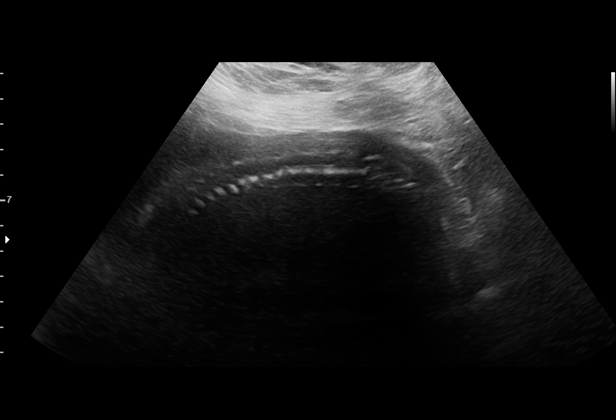
[im 25/61]
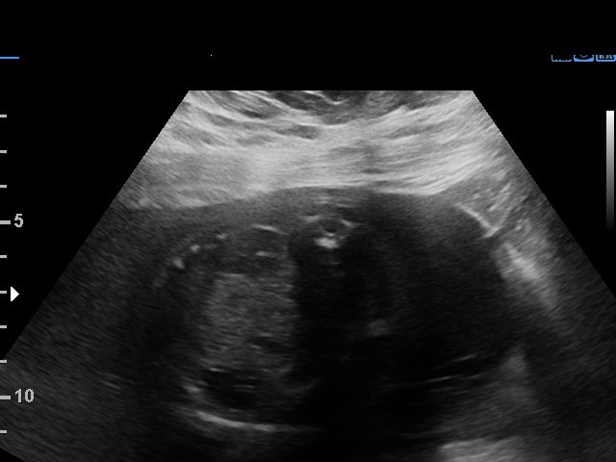
[im 29/61]
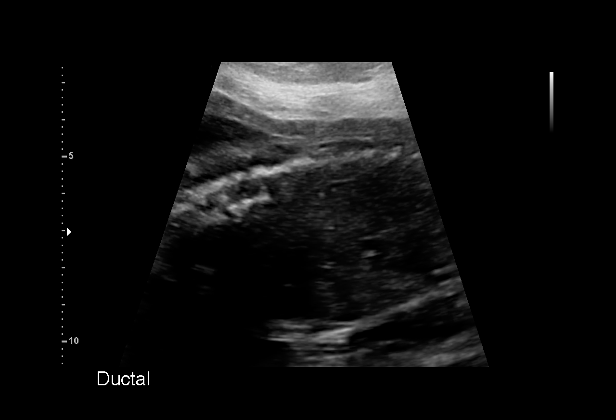
[im 34/61]
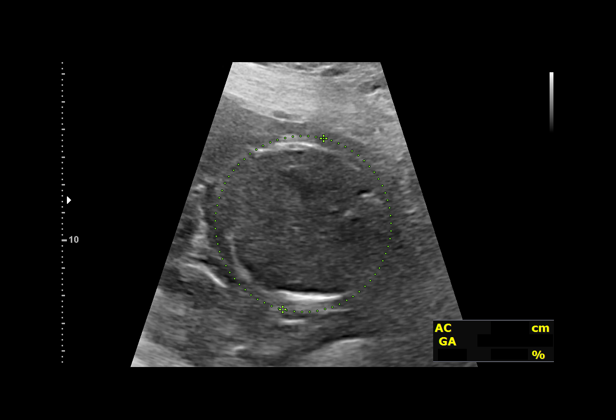
[im 38/61]
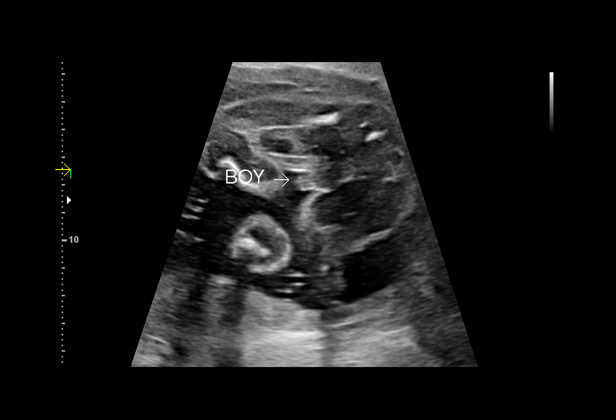
[im 43/61]
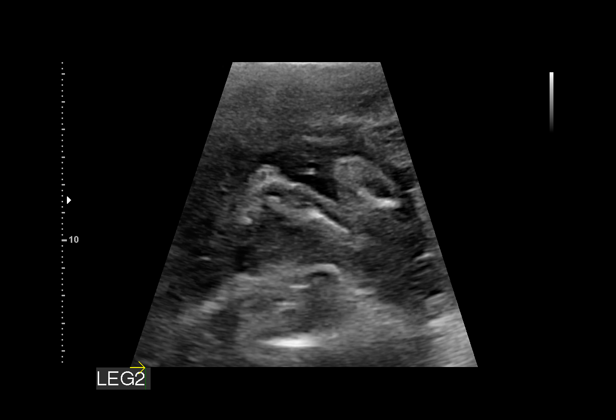
[im 47/61]
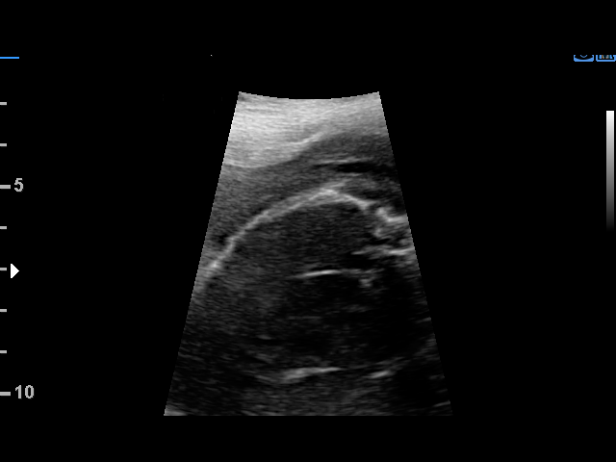
[im 52/61]
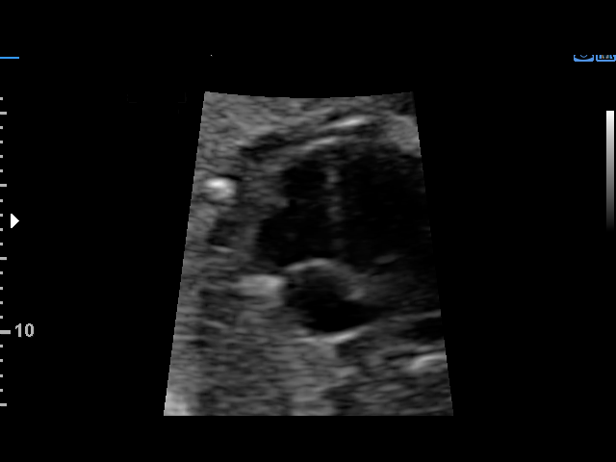
[im 56/61]
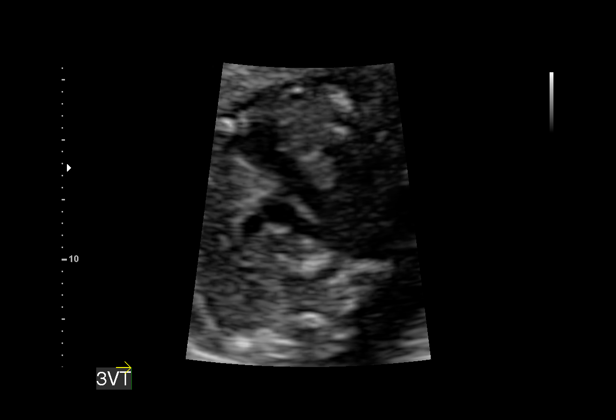
[im 61/61]
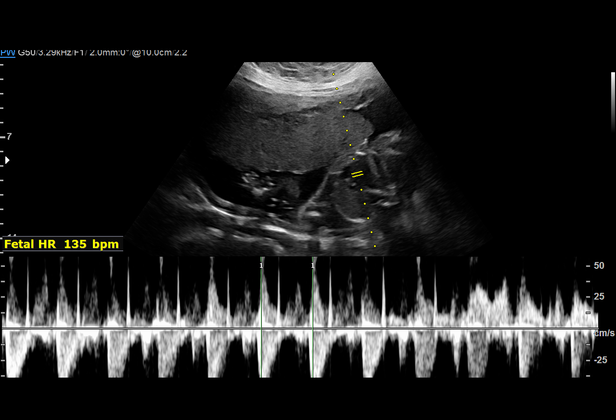

[14 of 28 positions shown; findings below may reference images not displayed]

[id number] [HOSPITAL]
                   LAW DO

Indications

 Hyperthyroid
 Encounter for antenatal screening for
 malformations
 Pregnancy resulting from assisted
 reproductive technology
 Obesity complicating pregnancy, second
 trimester BMI 46
 History of cesarean delivery, currently
 pregnant
 24 weeks gestation of pregnancy
Fetal Evaluation

 Num Of Fetuses:         1
 Fetal Heart Rate(bpm):  144
 Cardiac Activity:       Observed
 Presentation:           Breech
 Placenta:               Anterior
 P. Cord Insertion:      Visualized, central

 Amniotic Fluid
 AFI FV:      Within normal limits

                             Largest Pocket(cm)

Biometry
 BPD:      56.3  mm     G. Age:  23w 1d         17  %    CI:         64.8   %    70 - 86
                                                         FL/HC:      18.6   %    18.7 -
 HC:      224.8  mm     G. Age:  24w 4d         52  %    HC/AC:      1.14        1.05 -
 AC:      196.8  mm     G. Age:  24w 2d         53  %    FL/BPD:     74.2   %    71 - 87
 FL:       41.8  mm     G. Age:  23w 4d         25  %    FL/AC:      21.2   %    20 - 24
 HUM:      40.8  mm     G. Age:  24w 5d         59  %
 CER:      25.6  mm     G. Age:  23w 2d         43  %

 LV:        4.3  mm
 CM:        4.2  mm

 Est. FW:     654  gm      1 lb 7 oz     43  %
OB History

 Gravidity:    2         Term:   1
 Living:       1
Gestational Age

 LMP:           24w 0d        Date:  07/28/20                 EDD:   05/04/21
 U/S Today:     23w 6d                                        EDD:   05/05/21
 Best:          24w 0d     Det. By:  LMP  (07/28/20)          EDD:   05/04/21
Anatomy

 Cranium:               Appears normal         Aortic Arch:            Appears normal
 Cavum:                 Not well visualized    Ductal Arch:            Appears normal
 Ventricles:            Appears normal         Diaphragm:              Appears normal
 Choroid Plexus:        Appears normal         Stomach:                Appears normal, left
                                                                       sided
 Cerebellum:            Appears normal         Abdomen:                Appears normal
 Posterior Fossa:       Appears normal         Abdominal Wall:         Appears nml (cord
                                                                       insert, abd wall)
 Nuchal Fold:           Not applicable (>20    Cord Vessels:           Appears normal (3
                        wks GA)                                        vessel cord)
 Face:                  Not well visualized    Kidneys:                Appear normal
 Lips:                  Not well visualized    Bladder:                Appears normal
 Thoracic:              Not well visualized    Spine:                  Ltd views no
                                                                       intracranial signs of
                                                                       NTD
 Heart:                 Appears normal         Upper Extremities:      Not well visualized
                        (4CH, axis, and
                        situs)
 RVOT:                  Appears normal         Lower Extremities:      Appears normal
 LVOT:                  Appears normal

 Other:  Heels visualized. VC, 3VV and 3VTV not well visualized. Fetus
         appears to be a male. Technically difficult due to maternal habitus
         and fetal position.
Targeted Anatomy

 Thorax
 SVC:                   Appears normal         3 V Trachea View:       Appears normal
 3 Vessel View:         Appears normal         IVC:                    Appears normal
Cervix Uterus Adnexa
 Cervix
 Length:           4.58  cm.
 Normal appearance by transabdominal scan.

 Right Ovary
 Visualized.

 Left Ovary
 Visualized.
Impression

 We performed a fetal anatomy scan.  Fetal biometry is
 consistent with the previously established dates.  Amniotic
 fluid is normal and good fetal activity seen.  No markers of
 aneuploidies or fetal structural defects are seen.  Fetal heart
 rate and rhythm are normal.
 Placenta is anterior and there is no evidence of previa or
 placenta accreta spectrum (PAS).
 Fetal anatomical survey was limited because of maternal
 body habitus.
 xxxxxxxxxxxxxxxxxxxxxxxxxxxxxxxxxxxxxxxxxxxxxxxxxxxxxxxxx
 xxxxxxxxxxx
 Consultation (from [REDACTED])

 I had the pleasure of [facility]  She is here for fetal anatomy scan.
 Patient had fertility treatment and conceived by intrauterine
 insemination (donor sperm).
 Her medical history significant for diagnosis of
 hyperthyroidism (Graves' disease) in December 2018 in her
 previous pregnancy.  She had discontinued PTU after the first
 trimester and now takes methimazole 7.5 mg daily and her
 dosage was increased yesterday from 5 mg.  Her most recent
 TSH level assessed 3 days ago was 0.01 (consistent with
 hyperthyroidism).  She is being followed by her
 endocrinologist.
 Patient does not have symptoms of palpitations or chest pain
 or hair loss.  She reports she has been asymptomatic. She
 does not have a thyroid nodule.
 She does not have hypertension or diabetes or any other
 chronic medical conditions.
 Past surgical history: Cesarean section.
 Obstetric history significant for a term cesarean delivery in
 September 2019 (Estrellita Wegner) at 42 weeks gestation of a
 female infant weighing 7 pounds and 1 ounce at birth.  Her
 pregnancy was complicated by oligohydramnios.  Her
 daughter is in good health.
 Patient reports that TRAb levels assessed in the third
 trimester was normal.
 Allergies: Cimetidine (hives).
 Social history: Denies tobacco or drug or alcohol use.  She
 has been married 6 years and her husband is in good health.
 Family history: No history of venous thromboembolism in the
 family.
 Prenatal course: On cell free fetal DNA screening, the risks of
 fetal aneuploidies are not increased.  MSAFP screening
 showed low risk for open neural tube defects.  Fetal
 anatomical survey could not be completed at your office.
 Blood pressure today at her office is 106/58 mmHg and pulse
 71/minute.  Prepregnancy BMI 45.3.
 Our concerns include:
 Hyperthyroidism in pregnancy
 -Antithyroid drugs (ATD) form the first-line of treatment in
 hyperthyroidism in pregnancy. PTU was appropriately
 discontinued after first trimester and substituted with
 methimazole (MMI) in the second trimester.  Carbimazole
 (CBZ) and MMI have been reported to be associated with
 fetal congenital malformations including aplasia cutis,
 dysmorphic facial features and choanal or esophageal
 atresia. However, the absolute risk is very small.
 -ATD dosage should be adjusted to keep FT4 levels in the
 upper limits of normal. Dosage can be adjusted every 2 to 4
 weeks.
 -Beta-blocker (propranolol) should be added if patient has
 tachycardia (hyperadrenergic symptoms)
 -Radio-iodine treatment is contraindicated in pregnancy.
 Poorly-controlled hyperthyroidism can lead to fetal growth
 restriction, congestive heart failure, hydrops and preterm
 delivery. Gestational hypertension/preeclampsia and
 placental abruption are more common in pregnancies with
 hyperthyroidism. TRAb levels should be monitored in the
 second and third trimester. Increased TRAb levels (3 to 5
 times above normal) are associated with fetal and neonatal
 hyperthyroidism.
 Fetal hyperthyroidism can occur even in the presence of
 normal maternal FT4 levels if TRAb is increased. Fetal goiter,
 if present, is indicative of fetal hyperthyroidism.
 I recommend serial fetal growth assessments and weekly
 BPP from 32 or 34 weeks' gestation till delivery.
 Neonatal thyroid screening is the standard of care.
 Previous cesarean section
 I briefly discussed VBAC, its benefits and risks (1% scar
 dehiscence in labor).  Repeat cesarean section increases the
 risk of placenta previa or placenta accreta spectrum.
 VBAC/repeat cesarean delivery may be discussed in the third
 trimester.
Recommendations

 -Close follow-up with endocrinologist for adjustment of
 dosage and treatment with ATD.
 -Thyroid storm can occur if inadequately treated.
 -An appointment was made for her to return in 4 weeks for
 completion of fetal anatomy.
 -Serial fetal growth assessments (every 4 weeks) that may be
 performed at your office.
 -Weekly antenatal testing from 32 to 34 weeks until delivery.
                 Craven, Christophe

## 2022-04-27 ENCOUNTER — Encounter: Payer: Self-pay | Admitting: Family Medicine

## 2022-04-27 ENCOUNTER — Encounter: Payer: Self-pay | Admitting: General Practice

## 2022-04-30 ENCOUNTER — Encounter: Payer: Self-pay | Admitting: Family Medicine

## 2022-04-30 ENCOUNTER — Other Ambulatory Visit (HOSPITAL_COMMUNITY)
Admission: RE | Admit: 2022-04-30 | Discharge: 2022-04-30 | Disposition: A | Discharge status: Home / Self Care | Payer: Managed Care, Other (non HMO) | Source: Ambulatory Visit | Attending: Family Medicine | Admitting: Family Medicine

## 2022-04-30 ENCOUNTER — Ambulatory Visit: Payer: Managed Care, Other (non HMO) | Admitting: Family Medicine

## 2022-04-30 VITALS — BP 131/74 | HR 72 | Ht 65.0 in | Wt 289.0 lb

## 2022-04-30 DIAGNOSIS — Z01419 Encounter for gynecological examination (general) (routine) without abnormal findings: Secondary | ICD-10-CM | POA: Diagnosis not present

## 2022-04-30 DIAGNOSIS — E0500 Thyrotoxicosis with diffuse goiter without thyrotoxic crisis or storm: Secondary | ICD-10-CM

## 2022-04-30 DIAGNOSIS — Z803 Family history of malignant neoplasm of breast: Secondary | ICD-10-CM

## 2022-04-30 NOTE — Progress Notes (Signed)
ANNUAL EXAM Patient name: Madeline Meyer MRN [id number]  Date of birth: 05-10-1991 Chief Complaint:   Annual Exam  History of Present Illness:   Madeline Meyer is a 31 y.o.  [id number]  female  being seen today for a routine annual exam.  Current complaints: None Patient has had 2 prior IUI pregnancies with C-sections.  It appears that she had some adhesions.  Is uncertain of whether she wants to try to get pregnant again.  If she does, she and her husband are looking at doing IVF. She is currently on Mounjaro for weight loss.  She has lost 30 pounds in the past couple of months. She has normal menstrual cycles that are 3 days in length with a 28-day interval.  She does have heavy bleeding during her cycle. She also has Graves' disease, is followed by endocrinology, and is well controlled at this point on methimazole  Patient's last menstrual period was 03/17/2022 (approximate).    Last pap 2020. Results were: NILM w/ HRHPV negative. H/O abnormal pap: no Last mammogram: n/a.  She does have a strong family history of breast cancer in her family: Her mother and both grandmothers, all of which have been in the 44s or later. Last colonoscopy: n/a     02/16/2021    3:15 PM  Depression screen PHQ 2/9  Decreased Interest 0  Down, Depressed, Hopeless 0  PHQ - 2 Score 0         No data to display           Review of Systems:   Pertinent items are noted in HPI Denies any headaches, blurred vision, fatigue, shortness of breath, chest pain, abdominal pain, abnormal vaginal discharge/itching/odor/irritation, problems with periods, bowel movements, urination, or intercourse unless otherwise stated above. Pertinent History Reviewed:  Reviewed past medical,surgical, social and family history.  Reviewed problem list, medications and allergies. Physical Assessment:   Vitals:   04/30/22 0858  BP: 131/74  Pulse: 72  Weight: 289 lb (131.1 kg)  Height: _0  (1.651 m)  Body mass  index is 48.09 kg/m.        Physical Examination:   General appearance - well appearing, and in no distress  Mental status - alert, oriented to person, place, and time  Psych:  She has a normal mood and affect  Skin - warm and dry, normal color, no suspicious lesions noted  Chest - effort normal, all lung fields clear to auscultation bilaterally  Heart - normal rate and regular rhythm  Neck:  midline trachea, no thyromegaly or nodules  Breasts - breasts appear normal, no suspicious masses, no skin or nipple changes or axillary nodes  Abdomen - soft, nontender, nondistended, no masses or organomegaly  Pelvic - VULVA: normal appearing vulva with no masses, tenderness or lesions  VAGINA: normal appearing vagina with normal color and discharge, no lesions  CERVIX: normal appearing cervix without discharge or lesions, no CMT  Thin prep pap is done with HR HPV cotesting  UTERUS: uterus is felt to be normal size, shape, consistency and nontender   ADNEXA: No adnexal masses or tenderness noted.  Extremities:  No swelling or varicosities noted  Chaperone present for exam  Assessment & Plan:  1. Well woman exam with routine gynecological exam Pap done today.  We will follow the results Patient still thinking about pregnancy.  Discussed interval of pregnancies of greater than 1/2 years, especially with history of C-sections.  Patient to contact me when she desires a referral  to [facility]. - Cytology - PAP( [facility])  2. Family history of breast cancer Patient has arranged for BRCA testing.  She will forward me the results  3. Graves disease Continue with methimazole with her endocrinologist    No orders of the defined types were placed in this encounter.   Meds: No orders of the defined types were placed in this encounter.   Follow-up: No follow-ups on file.  Truett Mainland, DO 04/30/2022 11:57 AM

## 2022-05-07 LAB — CYTOLOGY - PAP
Adequacy: ABSENT
Comment: NEGATIVE
Diagnosis: NEGATIVE
High risk HPV: NEGATIVE

## 2022-05-25 IMAGING — US US MFM OB FOLLOW-UP
1 series · 13 of 28 positions shown · non-contrast
Comparison: none

[Series 1: us mfm ob follow-up · 47 acquisitions, 13 frames shown]
[im 2/47]
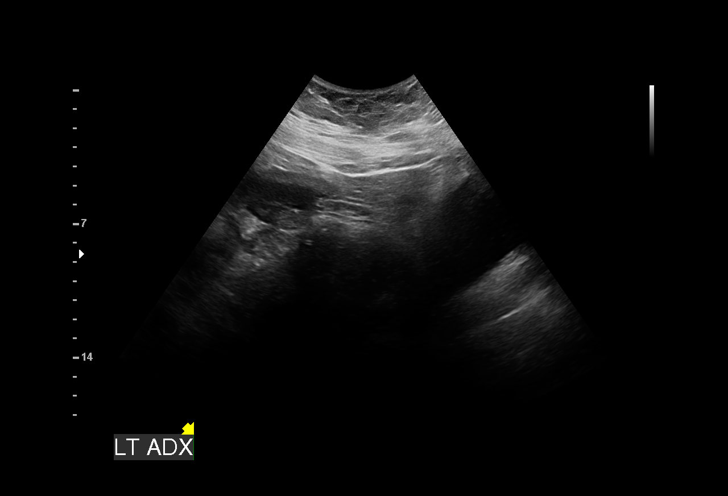
[im 6/47]
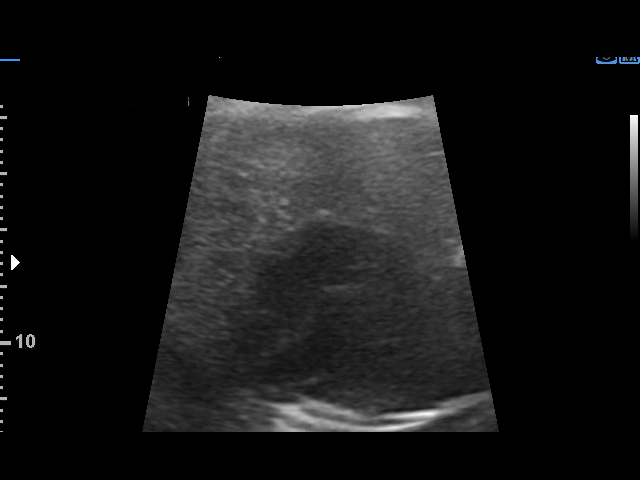
[im 9/47]
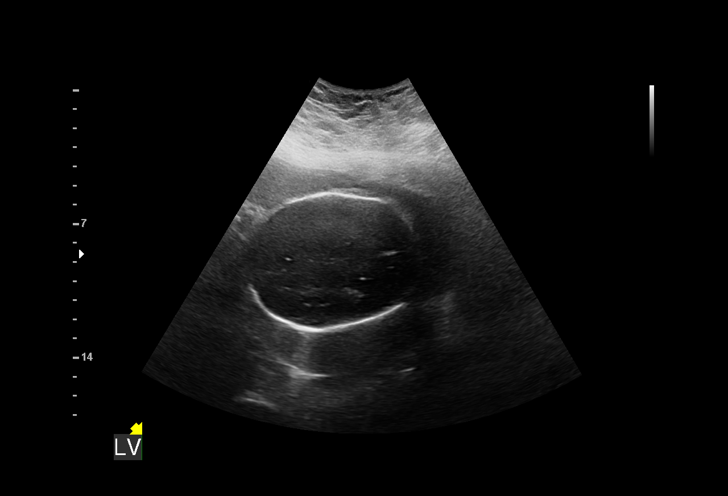
[im 12/47]
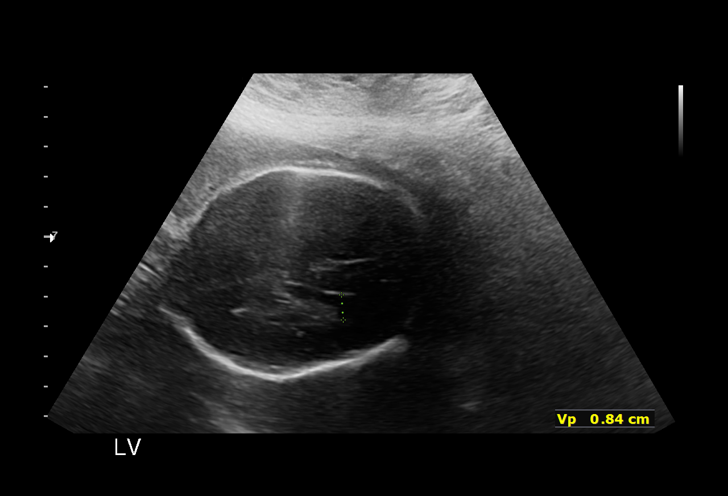
[im 16/47]
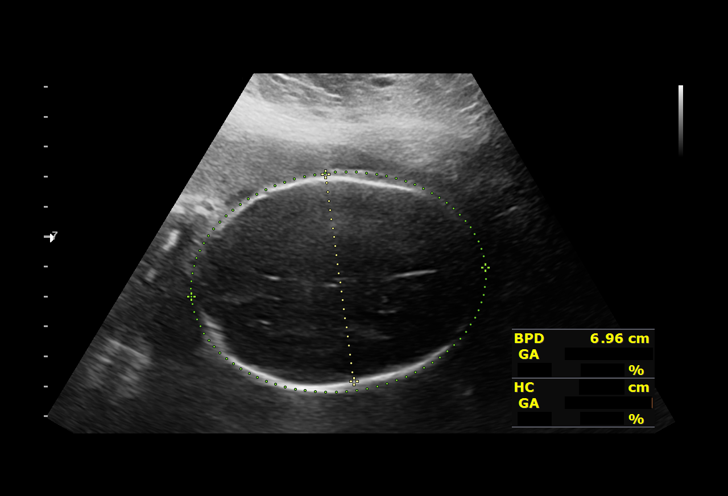
[im 19/47]
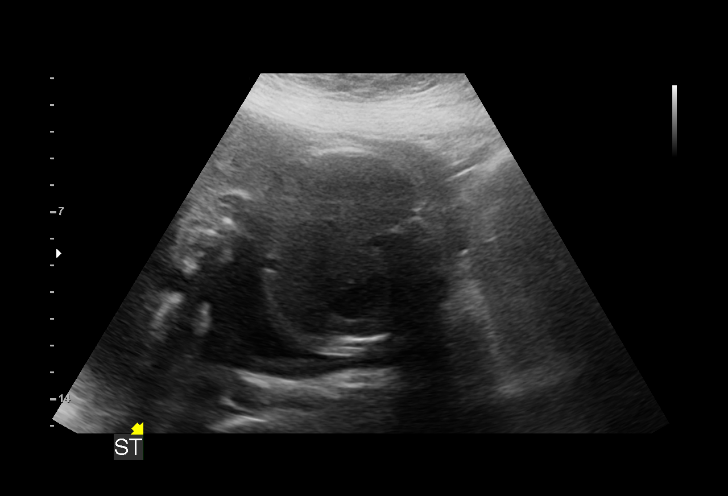
[im 24/47]
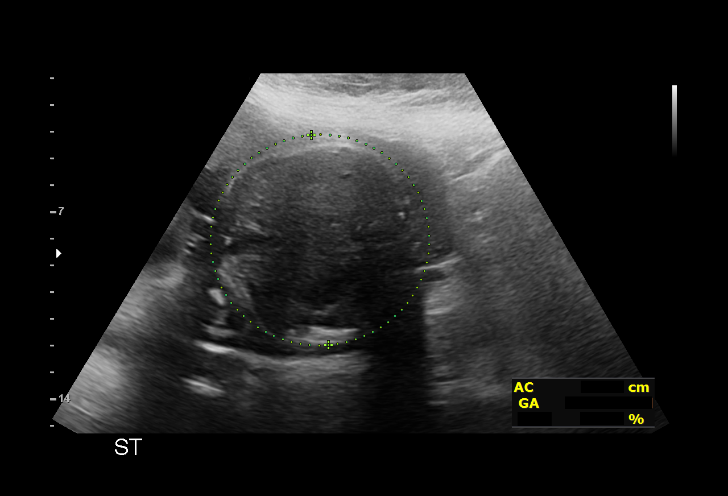
[im 28/47]
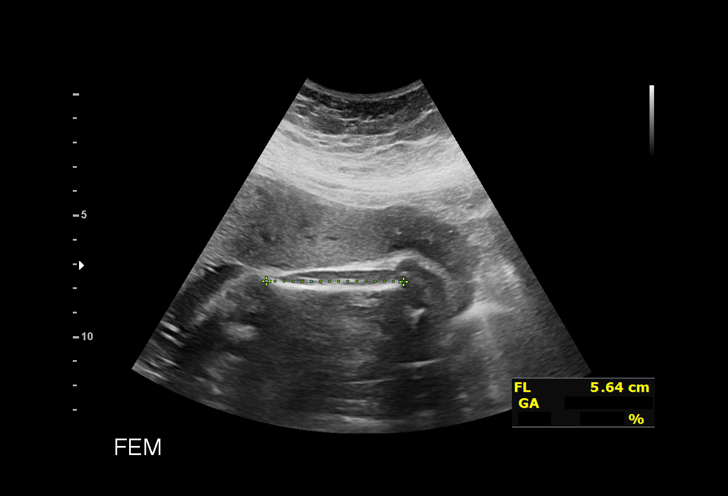
[im 31/47]
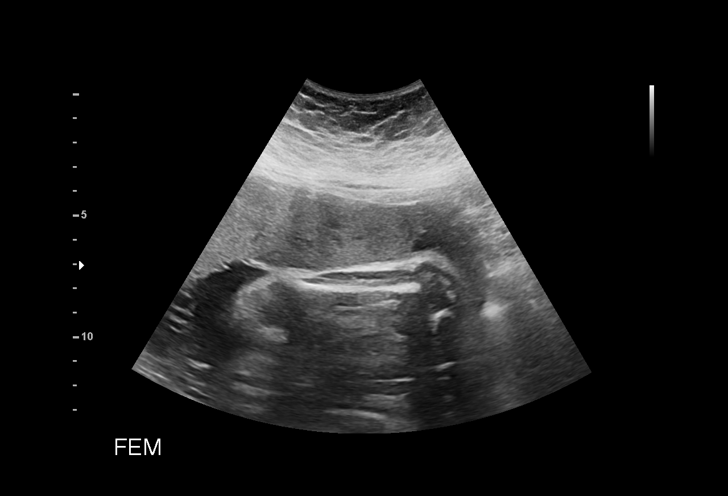
[im 35/47]
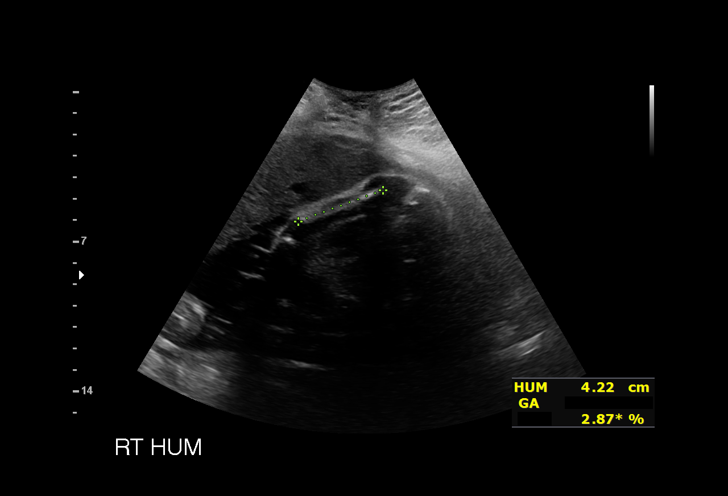
[im 38/47]
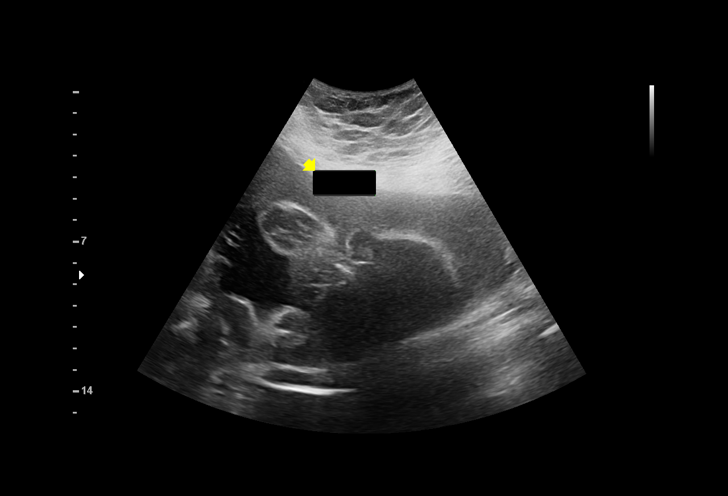
[im 41/47]
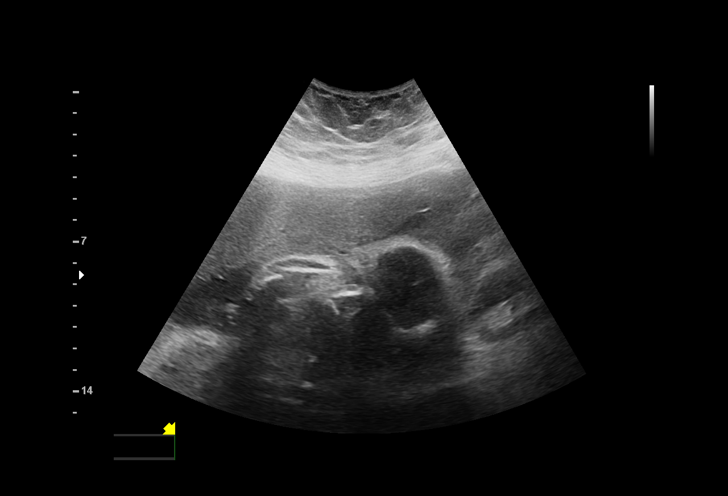
[im 45/47]
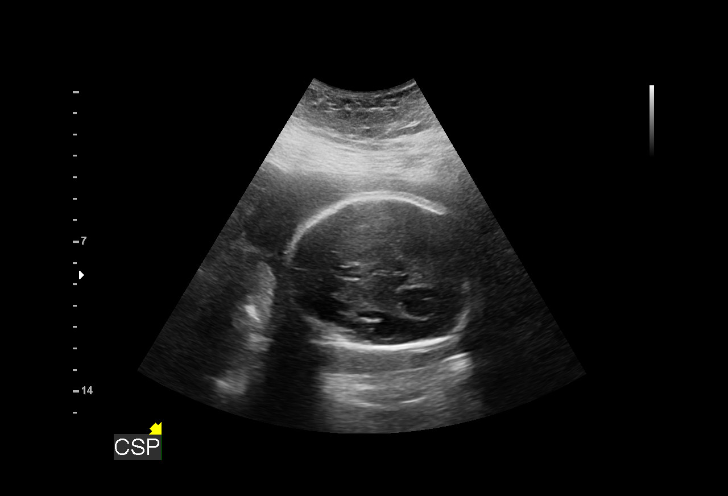

[13 of 28 positions shown; findings below may reference images not displayed]

[id number] [HOSPITAL]
                   LAW DO

Indications

 Hyperthyroid
 Obesity complicating pregnancy
 28 weeks gestation of pregnancy
 Encounter for other antenatal screening
 follow-up
 Pregnancy resulting from assisted
 reproductive technology
 History of cesarean delivery, currently
 pregnant
Fetal Evaluation

 Num Of Fetuses:         1
 Preg. Location:         Intrauterine
 Fetal Heart Rate(bpm):  126
 Cardiac Activity:       Observed
 Presentation:           Cephalic
 Placenta:               Anterior

 Amniotic Fluid
 AFI FV:      Within normal limits

 AFI Sum(cm)     %Tile       Largest Pocket(cm)
 14.21           47

 RUQ(cm)       RLQ(cm)       LUQ(cm)        LLQ(cm)
 5.8           3.48          0
Biometry

 BPD:        69  mm     G. Age:  27w 5d         22  %    CI:        66.76   %    70 - 86
                                                         FL/HC:      20.2   %    18.8 -
 HC:      270.6  mm     G. Age:  29w 4d         57  %    HC/AC:      1.09        1.05 -
 AC:      248.4  mm     G. Age:  29w 0d         67  %    FL/BPD:     79.1   %    71 - 87
 FL:       54.6  mm     G. Age:  28w 6d         52  %    FL/AC:      22.0   %    20 - 24
 HUM:      42.2  mm     G. Age:  25w 3d        < 5  %
 LV:        9.2  mm

 Est. FW:    4975  gm    2 lb 14 oz      63  %
OB History

 Gravidity:    2         Term:   1
 Living:       1
Gestational Age

 LMP:           28w 2d        Date:  07/28/20                 EDD:   05/04/21
 U/S Today:     28w 6d                                        EDD:   04/30/21
 Best:          28w 2d     Det. By:  LMP  (07/28/20)          EDD:   05/04/21
Anatomy

 Cranium:               Appears normal         Aortic Arch:            Previously seen
 Cavum:                 Appears normal         Ductal Arch:            Previously seen
 Ventricles:            Appears normal         Diaphragm:              Previously seen
 Choroid Plexus:        Previously seen        Stomach:                Appears normal, left
                                                                       sided
 Cerebellum:            Previously seen        Abdomen:                Previously seen
 Posterior Fossa:       Previously seen        Abdominal Wall:         Previously seen
 Nuchal Fold:           Not applicable (>20    Cord Vessels:           Previously seen
                        wks GA)
 Face:                  Limited Views          Kidneys:                RA visualized
 Lips:                  Limited Views          Bladder:                Appears normal
 Thoracic:              Not well visualized    Spine:                  Previously seen
 Heart:                 Limited VIews          Upper Extremities:      Limited VIews
 RVOT:                  Previously seen        Lower Extremities:      Previously seen
 LVOT:                  Previously seen

 Other:  Heels previously visualized. VC, 3VV and 3VTV previously visualized.
         Male gender previously seen. Technically difficult due to maternal
         habitus and fetal position.
Cervix Uterus Adnexa

 Cervix
 Normal appearance by transabdominal scan.

 Uterus
 No abnormality visualized.

 Right Ovary
 Not visualized.
 Left Ovary
 Not visualized.

 Cul De Sac
 No free fluid seen.

 Adnexa
 No abnormality visualized.
Comments

 This patient was seen for a follow up growth scan due to
 hyperthyroidism currently treated with methimazole.  She
 denies any problems since her last exam.
 She was informed that the fetal growth and amniotic fluid
 level appears appropriate for her gestational age.
 Due to hyperthyroidism, she should continue to be followed
 with monthly growth ultrasounds.  Weekly fetal testing should
 be started at around 32 weeks.
 The patient reports that she will have her future ultrasound
 exams and fetal testing scheduled in your office.
 No further exams were scheduled in our office.

## 2022-06-07 ENCOUNTER — Other Ambulatory Visit: Payer: Self-pay | Admitting: Genetic Counselor

## 2022-06-07 ENCOUNTER — Encounter: Payer: Self-pay | Admitting: Genetic Counselor

## 2022-06-07 DIAGNOSIS — Z803 Family history of malignant neoplasm of breast: Secondary | ICD-10-CM

## 2022-06-09 ENCOUNTER — Encounter: Payer: Managed Care, Other (non HMO) | Admitting: Genetic Counselor

## 2022-06-09 ENCOUNTER — Other Ambulatory Visit: Payer: Managed Care, Other (non HMO)

## 2022-08-12 ENCOUNTER — Ambulatory Visit: Payer: Managed Care, Other (non HMO) | Admitting: Internal Medicine

## 2022-08-12 ENCOUNTER — Encounter: Payer: Self-pay | Admitting: Internal Medicine

## 2022-08-12 VITALS — BP 114/70 | HR 82 | Ht 65.0 in | Wt 252.0 lb

## 2022-08-12 DIAGNOSIS — E0590 Thyrotoxicosis, unspecified without thyrotoxic crisis or storm: Secondary | ICD-10-CM | POA: Diagnosis not present

## 2022-08-12 DIAGNOSIS — E0500 Thyrotoxicosis with diffuse goiter without thyrotoxic crisis or storm: Secondary | ICD-10-CM

## 2022-08-12 LAB — T4, FREE: Free T4: 0.8 ng/dL (ref 0.60–1.60)

## 2022-08-12 LAB — TSH: TSH: 3.73 u[IU]/mL (ref 0.35–5.50)

## 2022-08-12 NOTE — Progress Notes (Signed)
Name: Madeline Meyer  MRN/ DOB: [id number], 1990-11-11    Age/ Sex: 32 y.o., female     PCP: Patient, No Pcp Per   Reason for Endocrinology Evaluation: Hyperthyroidism     Initial Endocrinology Clinic Visit: 04/21/2020    PATIENT IDENTIFIER: Madeline Meyer is a 32 y.o., female with a past medical history of hyperthyroidism and ADHD. She has followed with [facility] since 04/21/2020 for consultative assistance with management of her Hyperthyroidism   HISTORICAL SUMMARY: The patient was first diagnosed with hyperthyroidism in 12/2018 during pregnancy. She was on PTU during first trimester , then switched to methimazole . Was non compliant with medication intake post delivery , was lost to follow up until established with Dr Loanne Drilling 04/2020   She switched care from Dr. Loanne Drilling to me by 09/2020.  She had already been switched from methimazole to PTU in 07/2020.  By the time she saw me for the first time she was already pregnant in her first trimester at the time   We will switch PTU to methimazole during her second trimester in April 2022  TRAb < 1 during 3rd trimester 02/2021  She is S/P C-section 04/29/2021 of a baby boy ( [place] )      Maternal GM with Graves' disease  Paternal uncle with Graves' disease  SUBJECTIVE:    Today (08/12/2022):  Madeline Meyer is here for a follow up on hyperthyroidism  Has been losing weight - on mounjaro  Diagnosed with ADHD  Denies constipation,or diarrhea  Denies palpitations  Denies local neck symptoms  Denies tremors  Has had burning and itching of the eyes for the past month   Methimazole 10 mg, 1.5 daily      HISTORY:  Past Medical History:  Past Medical History:  Diagnosis Date   Alcohol abuse    Gestational diabetes    Graves disease    Hyperthyroidism    Lyme disease    Uterine polyp    Past Surgical History:  Past Surgical History:  Procedure Laterality Date   CESAREAN SECTION     CESAREAN  SECTION N/A 04/29/2021   Procedure: Repeat CESAREAN SECTION;  Surgeon: Langley Gauss A, DO;  Location: [facility];  Service: Obstetrics;  Laterality: N/A;  EDD: 05/04/21   DILATION AND CURETTAGE OF UTERUS     Social History:  reports that she has never smoked. She has never used smokeless tobacco. She reports that she does not currently use alcohol. She reports that she does not use drugs. Family History:  Family History  Problem Relation Age of Onset   Breast cancer Mother    Heart failure Father    Breast cancer Maternal Grandmother    Graves' disease Maternal Grandmother    Breast cancer Paternal Grandmother    Breast cancer Maternal Aunt      HOME MEDICATIONS: Allergies as of 08/12/2022       Reactions   Other Anaphylaxis   Tree nuts   Cimetidine Hives        Medication List        Accurate as of August 12, 2022  7:01 AM. If you have any questions, ask your nurse or doctor.          albuterol 108 (90 Base) MCG/ACT inhaler Commonly known as: VENTOLIN HFA   methimazole 10 MG tablet Commonly known as: TAPAZOLE Take 1.5 tablets (15 mg total) by mouth in the morning.   Mounjaro 2.5 MG/0.5ML Pen Generic drug: tirzepatide  OBJECTIVE:   PHYSICAL EXAM: VS:BP 114/70 (BP Location: Left Arm, Patient Position: Sitting, Cuff Size: Large)   Pulse 82   Ht 5' 5"$  (1.651 m)   Wt 252 lb (114.3 kg)   SpO2 99%   BMI 41.93 kg/m     EXAM: General: Pt appears well and is in NAD  Eyes: External eye exam normal without stare, lid lag or exophthalmos.  EOM intact.    Neck: General: Supple without adenopathy. Thyroid: Thyroid size normal.  No goiter or nodules appreciated.   Lung: Rales noted on the right   Heart: Auscultation: RRR.  Abdomen: Soft non-tender   Extremities:  BL LE: No pretibial edema normal ROM and strength.  Mental Status: Judgment, insight: Intact Orientation: Oriented to time, place, and person Mood and affect: No depression, anxiety, or  agitation     DATA REVIEWED:  Latest Reference Range & Units 08/12/22 07:52  TSH 0.35 - 5.50 uIU/mL 3.73  T4,Free(Direct) 0.60 - 1.60 ng/dL 0.80    ASSESSMENT / PLAN / RECOMMENDATIONS:    Hyperthyroidism secondary to Graves' Disease:   - Has been diagnosed with Graves' disease since 2020 with her first pregnancy.  - She is clinically euthyroid  - TFT's normal , will reduce methimazole       Medications   Decrease methimazole 10 mg, 1 tab daily    2. Graves' Disease:   - No extra thyroidal manifestations of thyroid disease       F/U in 6 months   Signed electronically by: Mack Guise, MD  [facility]  [facility] [address] [place], [place] [address] Phone: [phone] FAX: [phone]      CC: Patient, No Pcp Per No address on file Phone: None  Fax: None   Return to Endocrinology clinic as below: Future Appointments  Date Time Provider [place]  08/12/2022  7:30 AM Morgane Joerger, Melanie Crazier, MD [id number] None

## 2022-08-13 MED ORDER — METHIMAZOLE 10 MG PO TABS: 10.0000 mg | ORAL_TABLET | Freq: Every day | ORAL | 3 refills | Status: AC

## 2023-02-11 ENCOUNTER — Ambulatory Visit: Payer: Managed Care, Other (non HMO) | Admitting: Internal Medicine

## 2023-02-11 NOTE — Progress Notes (Deleted)
Name: Madeline Meyer  MRN/ DOB: [id number], August 22, 1990    Age/ Sex: 32 y.o., female     PCP: Jackelyn Poling, DO   Reason for Endocrinology Evaluation: Hyperthyroidism     Initial Endocrinology Clinic Visit: 04/21/2020    PATIENT IDENTIFIER: [name] is a 32 y.o., female with a past medical history of hyperthyroidism and ADHD. She has followed with [facility] since 04/21/2020 for consultative assistance with management of her Hyperthyroidism   HISTORICAL SUMMARY: The patient was first diagnosed with hyperthyroidism in 12/2018 during pregnancy. She was on PTU during first trimester , then switched to methimazole . Was non compliant with medication intake post delivery , was lost to follow up until established with Dr Everardo All 04/2020   She switched care from Dr. Everardo All to me by 09/2020.  She had already been switched from methimazole to PTU in 07/2020.  By the time she saw me for the first time she was already pregnant in her first trimester at the time   We will switch PTU to methimazole during her second trimester in April 2022  TRAb < 1 during 3rd trimester 02/2021  She is S/P C-section 04/29/2021 of a baby boy ( Madeline Meyer )      Maternal GM with Graves' disease  Paternal uncle with Graves' disease  SUBJECTIVE:    Today (02/11/2023):  Ms. [name] is here for a follow up on hyperthyroidism  Has been losing weight - on mounjaro  Diagnosed with ADHD  Denies constipation,or diarrhea  Denies palpitations  Denies local neck symptoms  Denies tremors  Has had burning and itching of the eyes for the past month   Methimazole 10 mg, 1.5 daily      HISTORY:  Past Medical History:  Past Medical History:  Diagnosis Date   Alcohol abuse    Gestational diabetes    Graves disease    Hyperthyroidism    Lyme disease    Uterine polyp    Past Surgical History:  Past Surgical History:  Procedure Laterality Date   CESAREAN SECTION     CESAREAN  SECTION N/A 04/29/2021   Procedure: Repeat CESAREAN SECTION;  Surgeon: Clance Boll A, DO;  Location: [facility];  Service: Obstetrics;  Laterality: N/A;  EDD: 05/04/21   DILATION AND CURETTAGE OF UTERUS     Social History:  reports that she has never smoked. She has never used smokeless tobacco. She reports that she does not currently use alcohol. She reports that she does not use drugs. Family History:  Family History  Problem Relation Age of Onset   Breast cancer Mother    Heart failure Father    Breast cancer Maternal Grandmother    Graves' disease Maternal Grandmother    Breast cancer Paternal Grandmother    Breast cancer Maternal Aunt      HOME MEDICATIONS: Allergies as of 02/11/2023       Reactions   Other Anaphylaxis   Tree nuts   Cimetidine Hives        Medication List        Accurate as of February 11, 2023 11:28 AM. If you have any questions, ask your nurse or doctor.          albuterol 108 (90 Base) MCG/ACT inhaler Commonly known as: VENTOLIN HFA   methimazole 10 MG tablet Commonly known as: TAPAZOLE Take 1 tablet (10 mg total) by mouth daily.   Mounjaro 2.5 MG/0.5ML Pen Generic drug: tirzepatide   Qelbree 200 MG 24  hr capsule Generic drug: viloxazine ER Take 200 mg by mouth daily.          OBJECTIVE:   PHYSICAL EXAM: [id number] were no vitals taken for this visit.    EXAM: General: Pt appears well and is in NAD  Eyes: External eye exam normal without stare, lid lag or exophthalmos.  EOM intact.    Neck: General: Supple without adenopathy. Thyroid: Thyroid size normal.  No goiter or nodules appreciated.   Lung: Rales noted on the right   Heart: Auscultation: RRR.  Abdomen: Soft non-tender   Extremities:  BL LE: No pretibial edema normal ROM and strength.  Mental Status: Judgment, insight: Intact Orientation: Oriented to time, place, and person Mood and affect: No depression, anxiety, or agitation     DATA REVIEWED:  Latest  Reference Range & Units 08/12/22 07:52  TSH 0.35 - 5.50 uIU/mL 3.73  T4,Free(Direct) 0.60 - 1.60 ng/dL 4.54    ASSESSMENT / PLAN / RECOMMENDATIONS:    Hyperthyroidism secondary to Graves' Disease:   - Has been diagnosed with Graves' disease since 2020 with her first pregnancy.  - She is clinically euthyroid  - TFT's normal , will reduce methimazole       Medications   Decrease methimazole 10 mg, 1 tab daily    2. Graves' Disease:   - No extra thyroidal manifestations of thyroid disease       F/U in 6 months   Signed electronically by: Lyndle Herrlich, MD  [facility]  [facility] [address] [place], [place] [address] Phone: [phone] FAX: [phone]      CC: Jackelyn Poling, DO [address] [place] [address] Phone: [phone]  Fax: [phone]   Return to Endocrinology clinic as below: Future Appointments  Date Time Provider Department Center  02/11/2023  3:00 PM , Konrad Dolores, MD [facility] None
